# Patient Record
Sex: Female | Born: 1980 | Race: Black or African American | Hispanic: No | Marital: Single | Smoking: Current every day smoker
Health system: Southern US, Community
[De-identification: ages and names within clinical notes are randomized; demographics above are authoritative.]

## PROBLEM LIST (undated history)

## (undated) HISTORY — PX: HERNIA REPAIR: SHX51

---

## 2000-07-30 ENCOUNTER — Emergency Department (HOSPITAL_COMMUNITY): Admission: EM | Admit: 2000-07-30 | Discharge: 2000-07-30 | Payer: Self-pay | Admitting: *Deleted

## 2000-11-24 ENCOUNTER — Emergency Department (HOSPITAL_COMMUNITY): Admission: EM | Admit: 2000-11-24 | Discharge: 2000-11-24 | Payer: Self-pay | Admitting: Emergency Medicine

## 2000-11-28 ENCOUNTER — Emergency Department (HOSPITAL_COMMUNITY): Admission: EM | Admit: 2000-11-28 | Discharge: 2000-11-28 | Payer: Self-pay | Admitting: Emergency Medicine

## 2001-09-03 ENCOUNTER — Emergency Department (HOSPITAL_COMMUNITY): Admission: EM | Admit: 2001-09-03 | Discharge: 2001-09-03 | Payer: Self-pay | Admitting: Emergency Medicine

## 2002-01-20 ENCOUNTER — Emergency Department (HOSPITAL_COMMUNITY): Admission: EM | Admit: 2002-01-20 | Discharge: 2002-01-21 | Payer: Self-pay | Admitting: *Deleted

## 2002-12-17 ENCOUNTER — Emergency Department (HOSPITAL_COMMUNITY): Admission: EM | Admit: 2002-12-17 | Discharge: 2002-12-17 | Payer: Self-pay | Admitting: Internal Medicine

## 2003-04-13 ENCOUNTER — Emergency Department (HOSPITAL_COMMUNITY): Admission: EM | Admit: 2003-04-13 | Discharge: 2003-04-14 | Payer: Self-pay | Admitting: *Deleted

## 2003-05-19 ENCOUNTER — Emergency Department (HOSPITAL_COMMUNITY): Admission: EM | Admit: 2003-05-19 | Discharge: 2003-05-19 | Payer: Self-pay | Admitting: *Deleted

## 2003-06-08 ENCOUNTER — Emergency Department (HOSPITAL_COMMUNITY): Admission: EM | Admit: 2003-06-08 | Discharge: 2003-06-09 | Payer: Self-pay | Admitting: Emergency Medicine

## 2003-06-09 ENCOUNTER — Emergency Department (HOSPITAL_COMMUNITY): Admission: EM | Admit: 2003-06-09 | Discharge: 2003-06-10 | Payer: Self-pay | Admitting: Emergency Medicine

## 2003-09-16 ENCOUNTER — Observation Stay (HOSPITAL_COMMUNITY): Admission: RE | Admit: 2003-09-16 | Discharge: 2003-09-16 | Payer: Self-pay | Admitting: Obstetrics and Gynecology

## 2003-10-07 ENCOUNTER — Ambulatory Visit (HOSPITAL_COMMUNITY): Admission: AD | Admit: 2003-10-07 | Discharge: 2003-10-07 | Payer: Self-pay | Admitting: Obstetrics and Gynecology

## 2003-10-25 ENCOUNTER — Ambulatory Visit (HOSPITAL_COMMUNITY): Admission: AD | Admit: 2003-10-25 | Discharge: 2003-10-25 | Payer: Self-pay | Admitting: Obstetrics and Gynecology

## 2003-11-30 ENCOUNTER — Observation Stay (HOSPITAL_COMMUNITY): Admission: AD | Admit: 2003-11-30 | Discharge: 2003-12-01 | Payer: Self-pay | Admitting: Obstetrics and Gynecology

## 2003-12-13 ENCOUNTER — Ambulatory Visit (HOSPITAL_COMMUNITY): Admission: AD | Admit: 2003-12-13 | Discharge: 2003-12-13 | Payer: Self-pay | Admitting: Obstetrics and Gynecology

## 2004-01-03 ENCOUNTER — Inpatient Hospital Stay (HOSPITAL_COMMUNITY): Admission: RE | Admit: 2004-01-03 | Discharge: 2004-01-05 | Payer: Self-pay | Admitting: Obstetrics & Gynecology

## 2004-04-05 ENCOUNTER — Emergency Department (HOSPITAL_COMMUNITY): Admission: EM | Admit: 2004-04-05 | Discharge: 2004-04-05 | Payer: Self-pay | Admitting: Emergency Medicine

## 2004-08-16 ENCOUNTER — Emergency Department (HOSPITAL_COMMUNITY): Admission: EM | Admit: 2004-08-16 | Discharge: 2004-08-16 | Payer: Self-pay | Admitting: Emergency Medicine

## 2004-09-15 ENCOUNTER — Emergency Department (HOSPITAL_COMMUNITY): Admission: EM | Admit: 2004-09-15 | Discharge: 2004-09-15 | Payer: Self-pay | Admitting: *Deleted

## 2004-09-22 ENCOUNTER — Emergency Department (HOSPITAL_COMMUNITY): Admission: EM | Admit: 2004-09-22 | Discharge: 2004-09-22 | Payer: Self-pay | Admitting: Emergency Medicine

## 2004-10-14 ENCOUNTER — Emergency Department (HOSPITAL_COMMUNITY): Admission: EM | Admit: 2004-10-14 | Discharge: 2004-10-14 | Payer: Self-pay | Admitting: Emergency Medicine

## 2004-12-17 ENCOUNTER — Emergency Department (HOSPITAL_COMMUNITY): Admission: EM | Admit: 2004-12-17 | Discharge: 2004-12-17 | Payer: Self-pay | Admitting: Emergency Medicine

## 2005-03-01 ENCOUNTER — Ambulatory Visit (HOSPITAL_COMMUNITY): Admission: AD | Admit: 2005-03-01 | Discharge: 2005-03-01 | Payer: Self-pay | Admitting: Obstetrics & Gynecology

## 2005-09-05 ENCOUNTER — Emergency Department (HOSPITAL_COMMUNITY): Admission: EM | Admit: 2005-09-05 | Discharge: 2005-09-05 | Payer: Self-pay | Admitting: Emergency Medicine

## 2005-12-14 ENCOUNTER — Emergency Department (HOSPITAL_COMMUNITY): Admission: EM | Admit: 2005-12-14 | Discharge: 2005-12-14 | Payer: Self-pay | Admitting: Emergency Medicine

## 2006-01-15 ENCOUNTER — Emergency Department (HOSPITAL_COMMUNITY): Admission: EM | Admit: 2006-01-15 | Discharge: 2006-01-15 | Payer: Self-pay | Admitting: Emergency Medicine

## 2006-05-08 ENCOUNTER — Emergency Department (HOSPITAL_COMMUNITY): Admission: EM | Admit: 2006-05-08 | Discharge: 2006-05-08 | Payer: Self-pay | Admitting: Emergency Medicine

## 2006-05-19 ENCOUNTER — Emergency Department (HOSPITAL_COMMUNITY): Admission: EM | Admit: 2006-05-19 | Discharge: 2006-05-20 | Payer: Self-pay | Admitting: Emergency Medicine

## 2006-05-28 ENCOUNTER — Emergency Department (HOSPITAL_COMMUNITY): Admission: EM | Admit: 2006-05-28 | Discharge: 2006-05-28 | Payer: Self-pay | Admitting: Emergency Medicine

## 2006-08-08 ENCOUNTER — Emergency Department (HOSPITAL_COMMUNITY): Admission: EM | Admit: 2006-08-08 | Discharge: 2006-08-08 | Payer: Self-pay | Admitting: Emergency Medicine

## 2007-01-25 ENCOUNTER — Emergency Department (HOSPITAL_COMMUNITY): Admission: EM | Admit: 2007-01-25 | Discharge: 2007-01-25 | Payer: Self-pay | Admitting: Emergency Medicine

## 2007-02-15 ENCOUNTER — Emergency Department (HOSPITAL_COMMUNITY): Admission: EM | Admit: 2007-02-15 | Discharge: 2007-02-15 | Payer: Self-pay | Admitting: Emergency Medicine

## 2007-07-04 ENCOUNTER — Emergency Department (HOSPITAL_COMMUNITY): Admission: EM | Admit: 2007-07-04 | Discharge: 2007-07-04 | Payer: Self-pay | Admitting: Emergency Medicine

## 2007-07-13 ENCOUNTER — Emergency Department (HOSPITAL_COMMUNITY): Admission: EM | Admit: 2007-07-13 | Discharge: 2007-07-13 | Payer: Self-pay | Admitting: Emergency Medicine

## 2007-09-08 ENCOUNTER — Emergency Department (HOSPITAL_COMMUNITY): Admission: EM | Admit: 2007-09-08 | Discharge: 2007-09-08 | Payer: Self-pay | Admitting: Emergency Medicine

## 2007-10-20 ENCOUNTER — Emergency Department (HOSPITAL_COMMUNITY): Admission: EM | Admit: 2007-10-20 | Discharge: 2007-10-20 | Payer: Self-pay | Admitting: Emergency Medicine

## 2008-03-30 ENCOUNTER — Observation Stay (HOSPITAL_COMMUNITY): Admission: RE | Admit: 2008-03-30 | Discharge: 2008-03-31 | Payer: Self-pay | Admitting: General Surgery

## 2008-03-30 ENCOUNTER — Encounter (INDEPENDENT_AMBULATORY_CARE_PROVIDER_SITE_OTHER): Payer: Self-pay | Admitting: General Surgery

## 2008-10-27 IMAGING — CR DG ABDOMEN ACUTE W/ 1V CHEST
3 series · 3 of 3 positions shown · non-contrast
Comparison: none

CLINICAL DATA: Abdominal pain.   
 ACUTE ABDOMINAL SERIES WITH CHEST - 3 VIEW:

[view not recorded (1 of 3)]
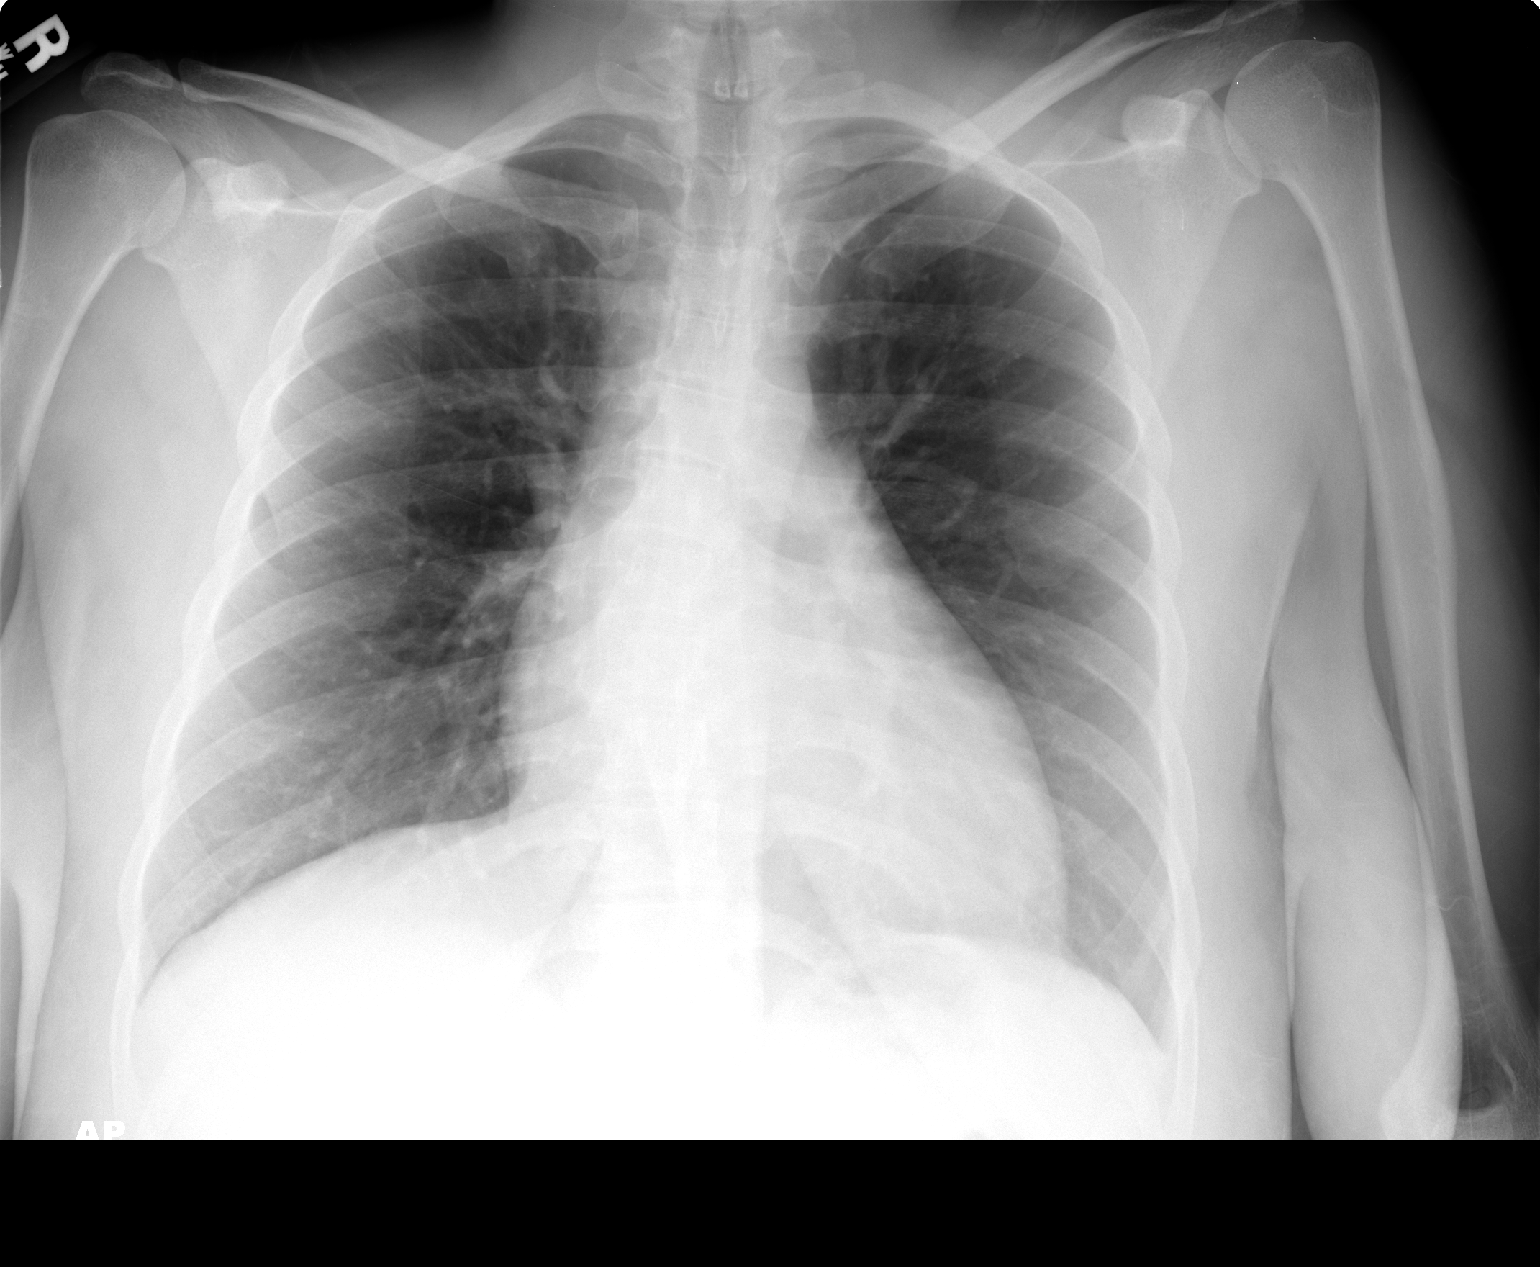

[view not recorded (2 of 3)]
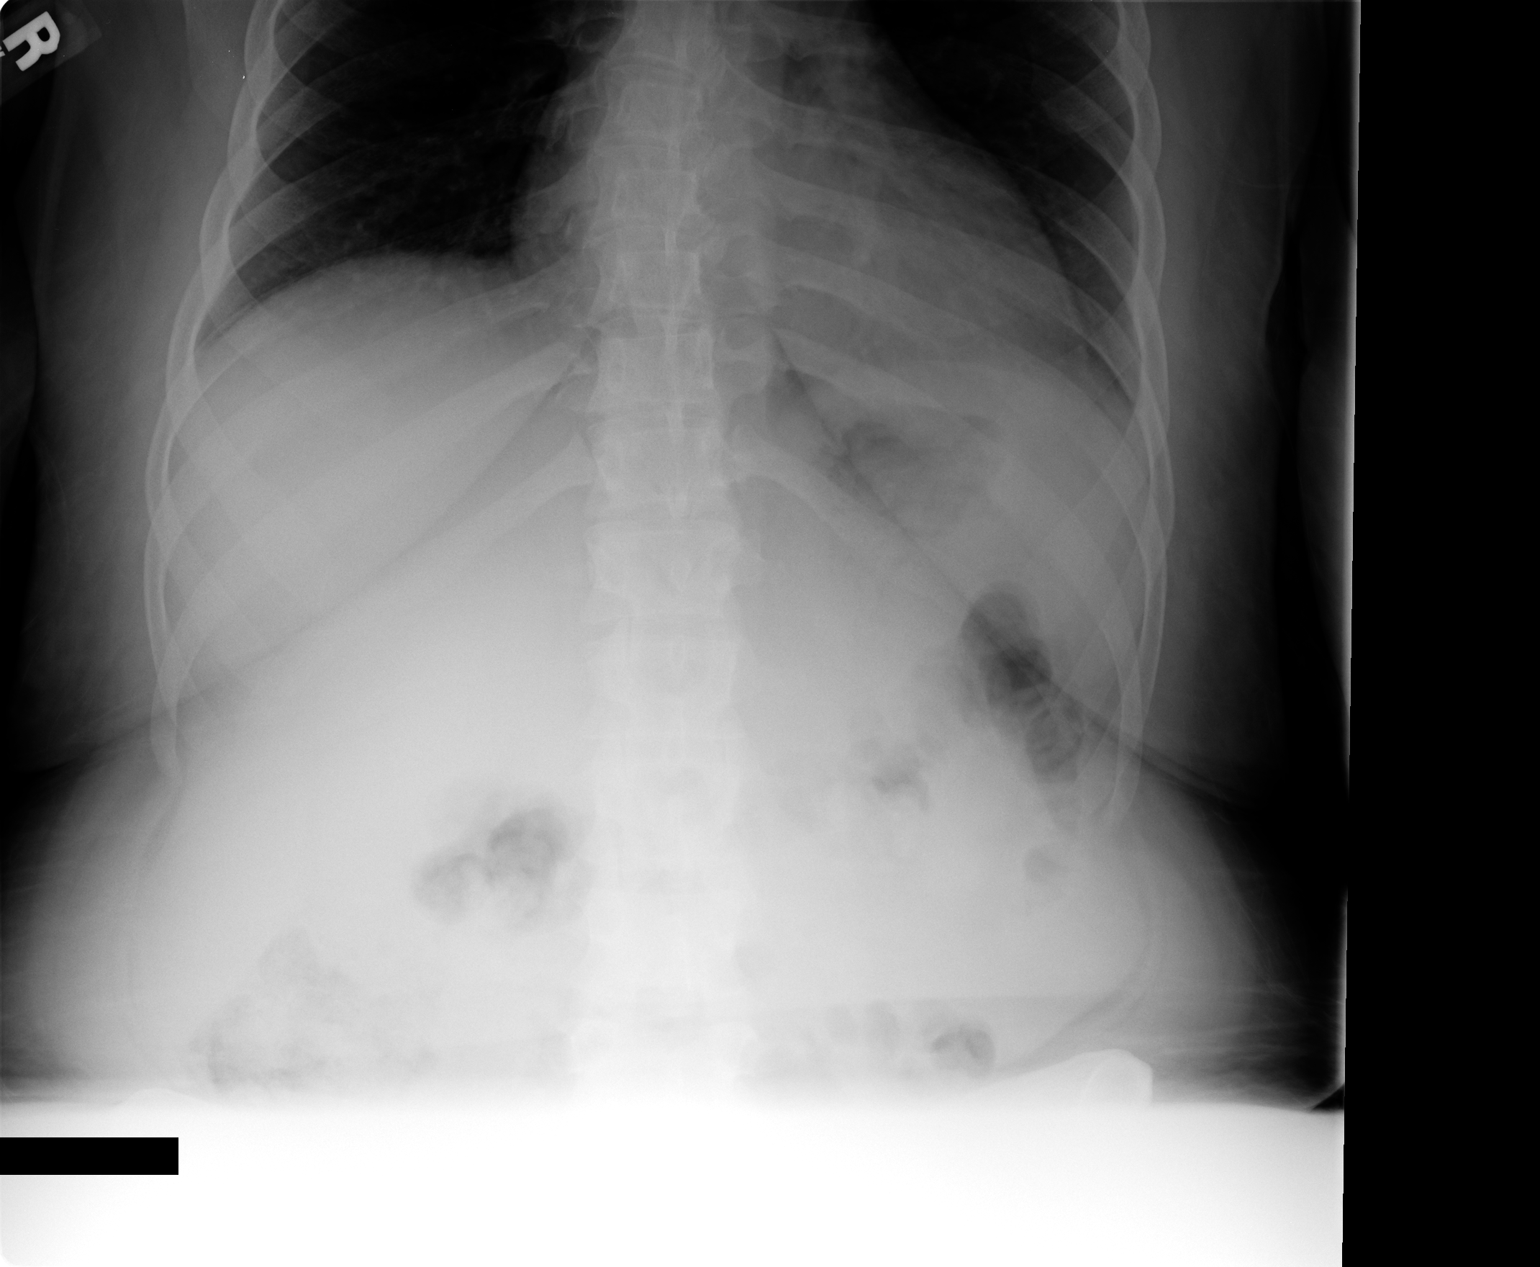

[view not recorded (3 of 3)]
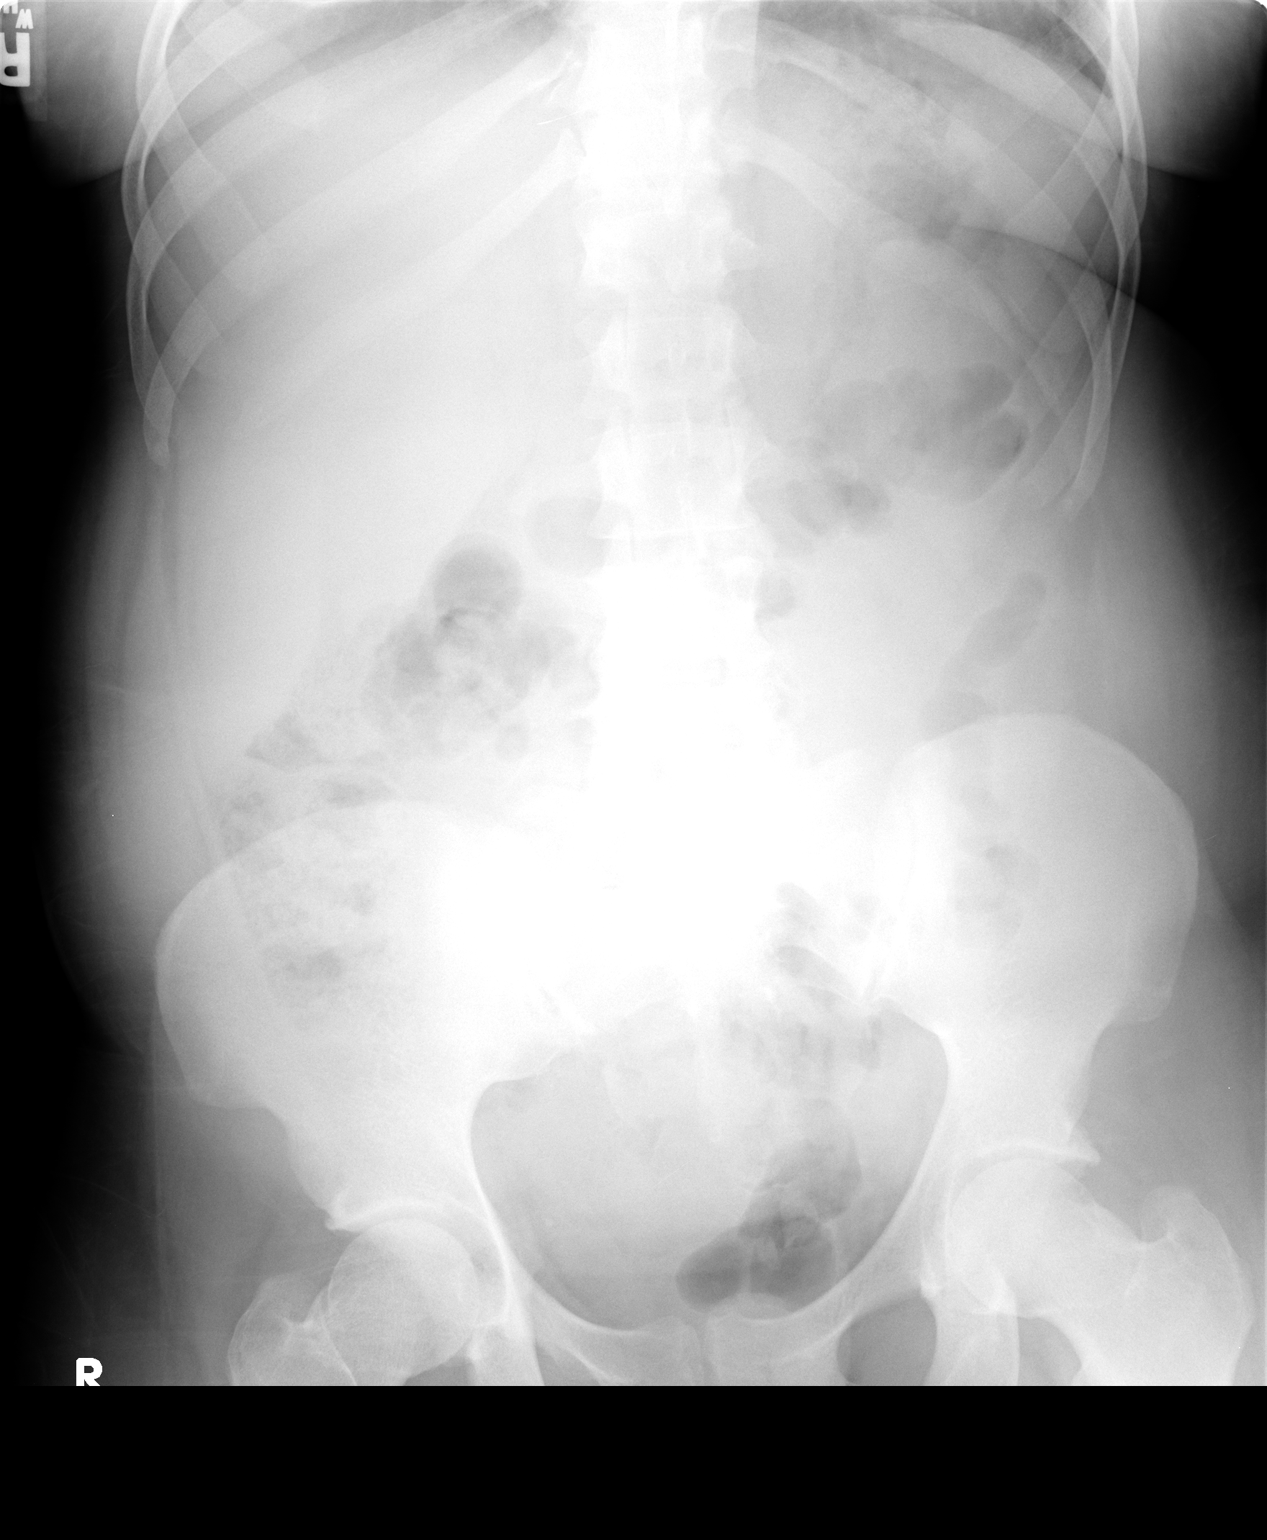

[3 of 3 positions shown; findings below may reference images not displayed]

FINDINGS: The heart is upper normal in size.  Lungs are clear. 
 In the abdomen, there is no free intraperitoneal gas. There are no disproportionately dilated loops of bowel.  Vascular calcifications are present.  Bony framework is within normal limits.
IMPRESSION: Nonobstructive bowel gas pattern.

## 2009-01-05 ENCOUNTER — Emergency Department (HOSPITAL_COMMUNITY): Admission: EM | Admit: 2009-01-05 | Discharge: 2009-01-06 | Payer: Self-pay | Admitting: Emergency Medicine

## 2009-11-05 ENCOUNTER — Emergency Department (HOSPITAL_COMMUNITY): Admission: EM | Admit: 2009-11-05 | Discharge: 2009-11-05 | Payer: Self-pay | Admitting: Emergency Medicine

## 2009-11-07 ENCOUNTER — Emergency Department (HOSPITAL_COMMUNITY): Admission: EM | Admit: 2009-11-07 | Discharge: 2009-11-07 | Payer: Self-pay | Admitting: Emergency Medicine

## 2009-12-16 ENCOUNTER — Emergency Department (HOSPITAL_COMMUNITY): Admission: EM | Admit: 2009-12-16 | Discharge: 2009-12-16 | Payer: Self-pay | Admitting: Emergency Medicine

## 2010-01-03 ENCOUNTER — Emergency Department (HOSPITAL_COMMUNITY): Admission: EM | Admit: 2010-01-03 | Discharge: 2010-01-03 | Payer: Self-pay | Admitting: Emergency Medicine

## 2010-05-03 LAB — HEPATIC FUNCTION PANEL
ALT: 18 U/L (ref 0–35)
AST: 29 U/L (ref 0–37)
Albumin: 3.8 g/dL (ref 3.5–5.2)
Alkaline Phosphatase: 92 U/L (ref 39–117)
Bilirubin, Direct: 0.1 mg/dL (ref 0.0–0.3)
Indirect Bilirubin: 0.7 mg/dL (ref 0.3–0.9)
Total Bilirubin: 0.8 mg/dL (ref 0.3–1.2)
Total Protein: 6.9 g/dL (ref 6.0–8.3)

## 2010-05-03 LAB — DIFFERENTIAL
Basophils Absolute: 0 K/uL (ref 0.0–0.1)
Basophils Absolute: 0.1 10*3/uL (ref 0.0–0.1)
Basophils Relative: 0 % (ref 0–1)
Eosinophils Absolute: 0.3 10*3/uL (ref 0.0–0.7)
Eosinophils Relative: 2 % (ref 0–5)
Eosinophils Relative: 3 % (ref 0–5)
Lymphocytes Relative: 12 % (ref 12–46)
Lymphocytes Relative: 32 % (ref 12–46)
Lymphs Abs: 1.2 10*3/uL (ref 0.7–4.0)
Lymphs Abs: 2.7 10*3/uL (ref 0.7–4.0)
Monocytes Absolute: 0.7 10*3/uL (ref 0.1–1.0)
Monocytes Relative: 7 % (ref 3–12)
Neutro Abs: 4.7 10*3/uL (ref 1.7–7.7)
Neutro Abs: 8.6 K/uL — ABNORMAL HIGH (ref 1.7–7.7)
Neutrophils Relative %: 79 % — ABNORMAL HIGH (ref 43–77)

## 2010-05-03 LAB — BASIC METABOLIC PANEL
BUN: 6 mg/dL (ref 6–23)
CO2: 26 mEq/L (ref 19–32)
CO2: 27 mEq/L (ref 19–32)
Calcium: 9.1 mg/dL (ref 8.4–10.5)
Calcium: 9.2 mg/dL (ref 8.4–10.5)
Creatinine, Ser: 0.82 mg/dL (ref 0.4–1.2)
Creatinine, Ser: 0.82 mg/dL (ref 0.4–1.2)
GFR calc Af Amer: >60 mL/min (ref >60)
GFR calc non Af Amer: >60 mL/min (ref >60)
GFR calc non Af Amer: >60 mL/min (ref >60)
Glucose, Bld: 88 mg/dL (ref 70–99)
Glucose, Bld: 95 mg/dL (ref 70–99)
Sodium: 138 mEq/L (ref 135–145)

## 2010-05-03 LAB — URINALYSIS, ROUTINE W REFLEX MICROSCOPIC
Bilirubin Urine: NEGATIVE
Glucose, UA: NEGATIVE mg/dL
Hgb urine dipstick: NEGATIVE
Ketones, ur: NEGATIVE mg/dL
Nitrite: NEGATIVE
Nitrite: NEGATIVE
Protein, ur: NEGATIVE mg/dL
Specific Gravity, Urine: 1.02 (ref 1.005–1.030)
Specific Gravity, Urine: 1.02 (ref 1.005–1.030)
Urobilinogen, UA: 0.2 mg/dL (ref 0.0–1.0)
Urobilinogen, UA: 0.2 mg/dL (ref 0.0–1.0)
pH: 6.5 (ref 5.0–8.0)
pH: 6.5 (ref 5.0–8.0)

## 2010-05-03 LAB — BASIC METABOLIC PANEL WITH GFR
BUN: 7 mg/dL (ref 6–23)
Chloride: 106 meq/L (ref 96–112)
Potassium: 3.8 meq/L (ref 3.5–5.1)
Sodium: 138 meq/L (ref 135–145)

## 2010-05-03 LAB — CBC
HCT: 36.3 % (ref 36.0–46.0)
Hemoglobin: 12.2 g/dL (ref 12.0–15.0)
MCH: 29.2 pg (ref 26.0–34.0)
MCHC: 33.7 g/dL (ref 30.0–36.0)
MCV: 86.6 fL (ref 78.0–100.0)
MCV: 87 fL (ref 78.0–100.0)
Platelets: 208 10*3/uL (ref 150–400)
Platelets: 233 10*3/uL (ref 150–400)
RBC: 4.19 MIL/uL (ref 3.87–5.11)
RBC: 4.47 MIL/uL (ref 3.87–5.11)
RDW: 13.9 % (ref 11.5–15.5)
RDW: 13.9 % (ref 11.5–15.5)
WBC: 10.9 K/uL — ABNORMAL HIGH (ref 4.0–10.5)
WBC: 8.2 10*3/uL (ref 4.0–10.5)

## 2010-05-03 LAB — WET PREP, GENITAL

## 2010-05-03 LAB — PREGNANCY, URINE: Preg Test, Ur: NEGATIVE

## 2010-05-03 LAB — GC/CHLAMYDIA PROBE AMP, GENITAL: GC Probe Amp, Genital: NEGATIVE

## 2010-05-03 LAB — URINE MICROSCOPIC-ADD ON

## 2010-05-03 LAB — RPR: RPR Ser Ql: NONREACTIVE

## 2010-06-05 LAB — BASIC METABOLIC PANEL
BUN: 8 mg/dL (ref 6–23)
CO2: 27 mEq/L (ref 19–32)
Calcium: 9 mg/dL (ref 8.4–10.5)
Chloride: 108 mEq/L (ref 96–112)
Creatinine, Ser: 0.75 mg/dL (ref 0.4–1.2)
GFR calc Af Amer: >60 mL/min (ref >60)
GFR calc non Af Amer: >60 mL/min (ref >60)
Glucose, Bld: 111 mg/dL — ABNORMAL HIGH (ref 70–99)
Potassium: 3.8 mEq/L (ref 3.5–5.1)
Sodium: 139 mEq/L (ref 135–145)

## 2010-06-05 LAB — CBC
MCHC: 33.6 g/dL (ref 30.0–36.0)
RBC: 4.3 MIL/uL (ref 3.87–5.11)
RDW: 14.2 % (ref 11.5–15.5)

## 2010-06-05 LAB — DIFFERENTIAL
Basophils Absolute: 0 10*3/uL (ref 0.0–0.1)
Basophils Relative: 0 % (ref 0–1)
Neutro Abs: 5.1 10*3/uL (ref 1.7–7.7)
Neutrophils Relative %: 55 % (ref 43–77)

## 2010-07-03 NOTE — Op Note (Signed)
Debbie Mercado, Debbie Mercado NO.:  [id number]   MEDICAL RECORD NO.:  [id number]          PATIENT TYPE:  OIB   LOCATION:  A327                          FACILITY:  [facility]   PHYSICIAN:  Barbaraann Barthel, M.D. DATE OF BIRTH:  Jan 30, 1981   DATE OF PROCEDURE:  03/30/2008  DATE OF DISCHARGE:                               OPERATIVE REPORT   PREOPERATIVE DIAGNOSIS:  Umbilical hernia.   POSTOPERATIVE DIAGNOSIS:  Umbilical hernia.   SPECIMEN:  Omentum.   WOUND CLASSIFICATION:  Clean.   NOTE:  Gross operative findings incarcerated umbilical hernia with  incarcerated omentum.  Defect approximately the size of 50 cent piece.   Note, this is a 30 year old black female who presented to my office  after being referred from the health department with an umbilical  hernia.  The patient had no GI symptoms and there was no obvious  incarcerated omentum present.  We planned for elective surgery.  We  discussed the procedure in detail, discussing complications not limited  including bleeding, infection, and recurrence.  Informed consent was  obtained.   TECHNIQUE:  The patient was placed in supine position after the adequate  administration of general anesthesia.  Her abdomen was prepped with  technique care prep because she had a seafood and a Betadine, iodine  allergy.  Prior to this, we aseptically placed a Foley catheter.  I then  made a smiley face incision in the inferior aspect of the umbilicus.  Dissected the umbilicus skin from the hernia sac and then opened the  hernia sac and removed the incarcerated umbilical hernia which was  tunneling subcutaneously.  Portion of this omentum was ligated with 2-0  silk and divided and sent as a specimen.  The rest was returned to the  abdominal cavity and then we closed the umbilical hernia defect using O  monofilament Prolene using horizontal mattress sutures and then using a  running suture over this to obtain a two-layer closure.  We then  used  1.5% Sensorcaine to help with postoperative comfort.  We irrigated the  subcutaneous area around and the incision with bacitracin solution and  then tacked the umbilicus skin to the fascia in order to reconstitute  the natural concave appearance of the umbilicus.  Closed the  subcutaneous tissue with 3-0 Polysorb and then obtained a subcuticular  closure cosmetically with a 5-0 Polysorb suture.  Quarter  inch Steri-Strips, 2 x 2 dressing permeated with bacitracin solution and  an OpSite dressing was applied.  Prior to closure all sponge, needle,  instrument counts found to be correct.  Estimated blood loss was  minimal.  The patient received 900 mL of crystalloids intraoperatively.  There were no complications.      Barbaraann Barthel, M.D.  Electronically Signed     WB/MEDQ  D:  03/30/2008  T:  03/31/2008  Job:  [id number]

## 2010-07-06 NOTE — Discharge Summary (Signed)
Debbie Mercado, Debbie Mercado             ACCOUNT NO.:  [id number]   MEDICAL RECORD NO.:  [id number]          PATIENT TYPE:  INP   LOCATION:  A401                          FACILITY:  [facility]   PHYSICIAN:  Lazaro Arms, M.D.   DATE OF BIRTH:  07-Jun-1980   DATE OF ADMISSION:  01/03/2004  DATE OF DISCHARGE:  11/17/2005LH                                 DISCHARGE SUMMARY   DISCHARGE DIAGNOSES:  1.  Status post repeat Cesarean section.  2.  Unremarkable postoperative course.   PROCEDURE:  Repeat Cesarean section.   HISTORY OF PRESENT ILLNESS:  Please refer to the transcribed history and  physical and operative report, prenatal chart for details of the patient's  admission to the hospital.   HOSPITAL COURSE:  The patient was admitted after surgery.  She had a totally  unremarkable course.  She remained afebrile.  She tolerated regular diet.  She voided without symptoms.  She was ambulatory.  Her hemoglobin and  hematocrit on postoperative day #1 were 10.1 and 28.9 with white blood cell  count of 10,600.  Her incision was clean, dry and intact.  She was  discharged to home on the morning of postoperative day #2 in good, stable  condition to follow up in the office next Monday, January 09, 2004 to have  her incision evaluated and staples removed.  She was given Tylox and Motrin  for pain upon the time of discharge.  She is given instructions and  precautions for contacting our office prior to Monday.     Luth   LHE/MEDQ  D:  01/05/2004  T:  01/05/2004  Job:  [id number]

## 2010-07-06 NOTE — Op Note (Signed)
NAMELUDELL, ZACARIAS             ACCOUNT NO.:  [id number]   MEDICAL RECORD NO.:  [id number]          PATIENT TYPE:  INP   LOCATION:  A401                          FACILITY:  [facility]   PHYSICIAN:  Lazaro Arms, M.D.   DATE OF BIRTH:  1980/08/13   DATE OF PROCEDURE:  DATE OF DISCHARGE:                                 OPERATIVE REPORT   PREOPERATIVE DIAGNOSES:  1.  Intrauterine pregnancy, 39 weeks.  2.  Previous C section.   POSTOPERATIVE DIAGNOSES:  1.  Intrauterine pregnancy, 39 weeks.  2.  Previous C section.   PROCEDURE:  Repeat cesarean section.   SURGEON:  Dr. Theora Gianotti.   ANESTHESIA:  Spinal.   FINDINGS:  Over a low-transverse hysterotomy incision was delivered a viable  female infant at 41 with Apgars of 9 and 9 with weight to be determined in  the nursing.  There was a three-vessel cord.  Cord blood and cord gas was  sent.  Placenta was normal and intact.  The uterus, tubes and ovaries were  all normal.   DESCRIPTION OF OPERATION:  The patient was taken to the operating room and  placed in the sitting position where she underwent a spinal anesthetic.  She  was then placed in the supine position with a roll under her right hip.  She  was prepped and draped in the usual sterile fashion.  A Pfannenstiel skin  incision was made and carried down sharply through the rectus fascia which  was scored in the midline and extended laterally.  The fascia was taken off  the muscles superiorly and inferiorly without difficulty.  The muscles were  divided.  Peritoneal cavity was entered.  A bladder blade was placed.  The  vesicouterine serosal flap was created.  A low-transverse hysterotomy  incision was made.  Over this incision was delivered a viable female infant  at 41 with Apgars of 9 and 9 with weight to be determined in the nursery.  There was a three vessel cord.  The cord blood and cord gas was sent.  The  placenta was normal and intact.  Uterus was exteriorized, closed in  two  layers, the first being a running, interlocking layer, and the second being  an imbricating layer.  An additional interrupted figure-of-eight suture was  placed for hemostasis.  The uterus was hemostatic.  The uterus was replaced  into the peritoneal cavity.  Both the pericolic gutters were irrigated using  warm normal saline.  The muscles and peritoneum reapproximated loosely.  The  fascia was closed using 0-Vicryl running, subcutaneous tissue was made  hemostatic and irrigated.  The skin was closed using skin staples.   The patient tolerated the procedure well.  She experienced 500 cc of blood  loss. She was taken to the recovery room in good, stable condition.  All  counts were correct x3.     Luth   LHE/MEDQ  D:  01/03/2004  T:  01/03/2004  Job:  [id number]

## 2010-07-06 NOTE — Discharge Summary (Signed)
Debbie Mercado, Debbie Mercado NO.:  [id number]   MEDICAL RECORD NO.:  [id number]          PATIENT TYPE:  OIB   LOCATION:  LDR5                          FACILITY:  [facility]   PHYSICIAN:  Lazaro Arms, M.D.   DATE OF BIRTH:  21-Mar-1980   DATE OF ADMISSION:  03/01/2005  DATE OF DISCHARGE:  01/12/2007LH                                 DISCHARGE SUMMARY   HOSPITAL COURSE:  [name] came to labor and delivery this morning with  complaints of being [redacted] weeks pregnant and having vaginal bleeding.  [name]  is a former patient of ours and, in fact, had a repeat C section a little  over a year ago.  She states that she has been living in [place] for  [facility] and has received prenatal care down there.  Upon further  questioning, she states that she had a positive pregnancy test in November,  and said that she was given a due date of Jul 08, 2005, which would place  her at about 21 weeks' gestation.  She says that she has not had any  ultrasound.  We were unable to get any prenatal records.  She said she  received care in [place], [place], but could not remember the name  of the doctor or the groups that she was seeing.  The nurses could not  obtain fetal heart tones on her, so I was asked to come evaluate her.  She  complained of just some spotting now.  I performed an abdominal ultrasound  and no pregnancy was detected.  I informed [name] of this and she  really did not react, just sort of chuckled a little bit.  I then ordered a  quantitative HCG in case it was just a very early pregnancy that I was  missing, however, that was less than 2.  [name] had signed out AMA  before the results of her lab tests were back saying that she had to go pick  her children up.  We told her to call back for the results of her blood test  and if it was positive, then we would bring her back in and do an  ultrasound.  At this time, as far as I know, she has not called back.   IMPRESSION:  Nonpregnant female with some spotting.  A physical exam had not  been done due to the patient's leaving against medical advice.      Jacklyn Shell, C.N.M.      Lazaro Arms, M.D.  Electronically Signed    FC/MEDQ  D:  03/01/2005  T:  03/01/2005  Job:  [id number]

## 2010-07-29 ENCOUNTER — Emergency Department (HOSPITAL_COMMUNITY)
Admission: EM | Admit: 2010-07-29 | Discharge: 2010-07-29 | Disposition: A | Discharge status: Home / Self Care | Payer: Medicaid Other | Attending: Emergency Medicine | Admitting: Emergency Medicine

## 2010-07-29 DIAGNOSIS — K420 Umbilical hernia with obstruction, without gangrene: Secondary | ICD-10-CM | POA: Insufficient documentation

## 2010-07-29 DIAGNOSIS — F17200 Nicotine dependence, unspecified, uncomplicated: Secondary | ICD-10-CM | POA: Insufficient documentation

## 2010-07-29 DIAGNOSIS — F3289 Other specified depressive episodes: Secondary | ICD-10-CM | POA: Insufficient documentation

## 2010-07-29 DIAGNOSIS — F329 Major depressive disorder, single episode, unspecified: Secondary | ICD-10-CM | POA: Insufficient documentation

## 2010-08-02 ENCOUNTER — Encounter (HOSPITAL_COMMUNITY): Payer: Medicaid Other

## 2010-08-02 ENCOUNTER — Other Ambulatory Visit: Payer: Self-pay | Admitting: General Surgery

## 2010-08-02 ENCOUNTER — Other Ambulatory Visit: Payer: Self-pay | Admitting: Pulmonary Disease

## 2010-08-02 LAB — BASIC METABOLIC PANEL
CO2: 29 mEq/L (ref 19–32)
Chloride: 102 mEq/L (ref 96–112)
GFR calc non Af Amer: >60 mL/min (ref >60)
Glucose, Bld: 90 mg/dL (ref 70–99)
Potassium: 4.1 mEq/L (ref 3.5–5.1)
Sodium: 138 mEq/L (ref 135–145)

## 2010-08-02 LAB — CBC
Hemoglobin: 12.4 g/dL (ref 12.0–15.0)
RBC: 4.34 MIL/uL (ref 3.87–5.11)
WBC: 9 10*3/uL (ref 4.0–10.5)

## 2010-08-02 LAB — SURGICAL PCR SCREEN: Staphylococcus aureus: NEGATIVE

## 2010-08-02 LAB — DIFFERENTIAL
Basophils Absolute: 0 10*3/uL (ref 0.0–0.1)
Basophils Relative: 0 % (ref 0–1)
Neutro Abs: 5.2 10*3/uL (ref 1.7–7.7)
Neutrophils Relative %: 58 % (ref 43–77)

## 2010-08-03 ENCOUNTER — Other Ambulatory Visit: Payer: Self-pay | Admitting: General Surgery

## 2010-08-03 ENCOUNTER — Observation Stay (HOSPITAL_COMMUNITY)
Admission: RE | Admit: 2010-08-03 | Discharge: 2010-08-04 | Disposition: A | Discharge status: Home / Self Care | Payer: Medicaid Other | Source: Ambulatory Visit | Attending: General Surgery | Admitting: General Surgery

## 2010-08-03 DIAGNOSIS — K436 Other and unspecified ventral hernia with obstruction, without gangrene: Principal | ICD-10-CM | POA: Insufficient documentation

## 2010-08-03 DIAGNOSIS — Z01812 Encounter for preprocedural laboratory examination: Secondary | ICD-10-CM | POA: Insufficient documentation

## 2010-08-03 LAB — HCG, SERUM, QUALITATIVE: Preg, Serum: NEGATIVE

## 2010-08-15 NOTE — Op Note (Signed)
  Debbie Mercado, Debbie NO.:  [id number]  MEDICAL RECORD NO.:  [id number]  LOCATION:  A214                          FACILITY:  [facility]  PHYSICIAN:  Barbaraann Barthel, M.D. DATE OF BIRTH:  02-05-1981  DATE OF PROCEDURE:  08/03/2010 DATE OF DISCHARGE:                              OPERATIVE REPORT   SURGEON:  Barbaraann Barthel, MD  PREOPERATIVE DIAGNOSIS:  Ventral hernia.  POSTOPERATIVE DIAGNOSIS:  Ventral hernia (supraumbilical with incarcerated omentum).  Note, this is a 30 year old black female who presented with a painful mass above the umbilicus in the midline.  This was clinically a ventral hernia.  She had undergone an umbilical hernia repair in 2010.  However, this did not appear to be related to a recurrence.  We made plans for elective repair discussing complications including bleeding, infection, and the possibility that mesh might be required.  Informed consent was obtained.  GROSS OPERATIVE FINDINGS:  The patient had a defect about the size of a silver dollar that in my opinion did not require any mesh prosthesis.  I did palpate down to the umbilicus, and there was no sign of any recurrent umbilical hernia defect.  TECHNIQUE:  The patient was placed in supine position after the adequate administration of general anesthesia via endotracheal intubation.  Her abdomen was prepped with antiseptic solution that did not have Betadine in it because of her alleged allergy to Debbie Mercado.  She was prepped and draped, and a Foley catheter was aseptically inserted, and incision was carried out on the midline through skin, subcutaneous tissue down to the fascia, which then revealed incarcerated omentum.  We returned most of the omentum to the intra-abdominal cavity.  There was a portion which was very adherent, which we ligated with 2-0 silk and divided, and then we closed the defect using horizontal mattress sutures using 0 monofilament Prolene sutures.  We then  irrigated, I then closed the subcutaneous space with 3-0 Polysorb sutures, and used 0.5% Sensorcaine around the fascial defect prior to tying the sutures for postoperative comfort.  The skin was approximated with the stapling device.  Prior to closure, all sponge, needle, and instrument counts found to be correct. Estimated blood loss was minimal.  There were no complications.  The patient was taken to the recovery room in satisfactory condition.    Barbaraann Barthel, M.D.    WB/MEDQ  D:  08/03/2010  T:  08/04/2010  Job:  [id number]  Electronically Signed by Barbaraann Barthel M.D. on 08/15/2010 10:08:55 AM

## 2010-08-15 NOTE — Discharge Summary (Signed)
  Debbie Mercado, HOR NO.:  [id number]  MEDICAL RECORD NO.:  [id number]  LOCATION:  A214                          FACILITY:  [facility]  PHYSICIAN:  Barbaraann Barthel, M.D. DATE OF BIRTH:  May 28, 1980  DATE OF ADMISSION:  08/03/2010 DATE OF DISCHARGE:  06/16/2012LH                              DISCHARGE SUMMARY   DIAGNOSIS:  Ventral hernia.  PROCEDURE:  Ventral hernia repair (no mesh required) and this was done on August 03, 2010.  NOTE:  This is a 30 year old black female who presented with a mass in her midline above the umbilicus.  This was consistent with a ventral hernia.  She had undergone a previous repair of an umbilical hernia in 2010.  This did not appear to be related to the previous repair and in fact intraoperatively was found to be completely separate above the umbilicus and a separate defect approximately the size of a silver dollar.  This was closed primarily without the use of mesh.  The patient was brought into the hospital as an observation patient and did well and was discharged on the following day.  Her hospital course was completely uneventful.  Her wound was clean.  At the time of discharge, she had no leg pain or dysuria or shortness of breath and the patient was tolerating p.o. well.  This patient will be followed perioperatively after which she is to return to her regular medical physician for followup.     Barbaraann Barthel, M.D.     WB/MEDQ  D:  08/04/2010  T:  08/04/2010  Job:  [id number]  Electronically Signed by Barbaraann Barthel M.D. on 08/15/2010 10:09:34 AM

## 2010-11-14 LAB — BASIC METABOLIC PANEL
Calcium: 9.3
GFR calc Af Amer: >60
GFR calc non Af Amer: >60
Glucose, Bld: 86
Sodium: 137

## 2010-11-14 LAB — CBC
Hemoglobin: 13.3
RDW: 13.7

## 2010-11-14 LAB — RAPID URINE DRUG SCREEN, HOSP PERFORMED
Barbiturates: NOT DETECTED
Benzodiazepines: NOT DETECTED
Cocaine: NOT DETECTED
Opiates: NOT DETECTED

## 2010-11-14 LAB — DIFFERENTIAL
Basophils Absolute: 0
Lymphocytes Relative: 36
Monocytes Absolute: 0.5
Neutro Abs: 5.8

## 2010-11-21 LAB — URINALYSIS, ROUTINE W REFLEX MICROSCOPIC
Bilirubin Urine: NEGATIVE
Glucose, UA: NEGATIVE
Hgb urine dipstick: NEGATIVE
Specific Gravity, Urine: 1.02
pH: 7

## 2010-11-26 LAB — CBC
Hemoglobin: 13.1
MCHC: 32.8
MCV: 84.4
RBC: 4.72

## 2010-11-26 LAB — DIFFERENTIAL
Basophils Absolute: 0
Eosinophils Absolute: 0.6
Lymphocytes Relative: 42
Lymphs Abs: 3.7
Neutrophils Relative %: 45

## 2010-11-26 LAB — URINALYSIS, ROUTINE W REFLEX MICROSCOPIC
Bilirubin Urine: NEGATIVE
Ketones, ur: NEGATIVE
Nitrite: NEGATIVE
Urobilinogen, UA: 0.2
pH: 5.5

## 2010-11-26 LAB — URINE MICROSCOPIC-ADD ON

## 2010-11-26 LAB — COMPREHENSIVE METABOLIC PANEL
ALT: 14
CO2: 27
Calcium: 8.9
Chloride: 107
Creatinine, Ser: 0.83
GFR calc non Af Amer: >60
Glucose, Bld: 103 — ABNORMAL HIGH
Total Bilirubin: 0.2 — ABNORMAL LOW

## 2010-11-26 LAB — LIPASE, BLOOD: Lipase: 29

## 2010-12-02 ENCOUNTER — Encounter: Payer: Self-pay | Admitting: *Deleted

## 2010-12-02 ENCOUNTER — Emergency Department (HOSPITAL_COMMUNITY)
Admission: EM | Admit: 2010-12-02 | Discharge: 2010-12-02 | Disposition: A | Discharge status: Home / Self Care | Payer: Medicaid Other | Attending: Emergency Medicine | Admitting: Emergency Medicine

## 2010-12-02 DIAGNOSIS — R51 Headache: Secondary | ICD-10-CM

## 2010-12-02 MED ORDER — METOCLOPRAMIDE HCL 5 MG/ML IJ SOLN
10.0000 mg | Freq: Once | INTRAMUSCULAR | Status: AC
  Administered 2010-12-02: 10 mg via INTRAVENOUS
  Filled 2010-12-02: qty 2

## 2010-12-02 MED ORDER — DIPHENHYDRAMINE HCL 50 MG/ML IJ SOLN
25.0000 mg | Freq: Once | INTRAMUSCULAR | Status: AC
  Administered 2010-12-02: 25 mg via INTRAVENOUS
  Filled 2010-12-02: qty 1

## 2010-12-02 MED ORDER — KETOROLAC TROMETHAMINE 30 MG/ML IJ SOLN
30.0000 mg | Freq: Once | INTRAMUSCULAR | Status: AC
  Administered 2010-12-02: 30 mg via INTRAVENOUS
  Filled 2010-12-02: qty 1

## 2010-12-02 MED ORDER — ONDANSETRON HCL 4 MG/2ML IJ SOLN
4.0000 mg | Freq: Once | INTRAMUSCULAR | Status: AC
  Administered 2010-12-02: 4 mg via INTRAVENOUS
  Filled 2010-12-02: qty 2

## 2010-12-02 NOTE — ED Provider Notes (Signed)
History     CSN: [id number] Arrival date & time: 12/02/2010  4:05 AM  Chief Complaint  Patient presents with  . Headache    (Consider location/radiation/quality/duration/timing/severity/associated sxs/prior treatment) HPI Comments: Seen 0501. Patient with sudden onset frontal headache while lying down. Denies vision changes, hearing changes, stiff neck, nausea, vomiting, difficulty talking or swallowing.Denies numbness, tingling, weakness.  Patient is a 30 y.o. female presenting with headaches. The history is provided by the patient.  Headache  The current episode started less than 1 hour ago (Patient was lying down and felt the headache coming on. Frontal, throbbing.). The problem occurs constantly. The problem has not changed since onset.The headache is associated with bright light and loud noise. The pain is located in the frontal region. The quality of the pain is described as sharp and throbbing. The pain is at a severity of 6/10. The pain is moderate. The pain does not radiate. Pertinent negatives include no anorexia, no fever, no near-syncope, no nausea and no vomiting. She has tried nothing for the symptoms.    History reviewed. No pertinent past medical history.  History reviewed. No pertinent past surgical history.  No family history on file.  History  Substance Use Topics  . Smoking status: Never Smoker   . Smokeless tobacco: Not on file  . Alcohol Use: No    OB History    Grav Para Term Preterm Abortions TAB SAB Ect Mult Living                  Review of Systems  Constitutional: Negative for fever.  Cardiovascular: Negative for near-syncope.  Gastrointestinal: Negative for nausea, vomiting and anorexia.  Neurological: Positive for headaches.  All other systems reviewed and are negative.    Allergies  Review of patient's allergies indicates no known allergies.  Home Medications  No current outpatient prescriptions on file.  BP 141/71  Pulse 77   Temp(Src) 98.3 F (36.8 C) (Oral)  Resp 20  Ht 5\' 4"  (1.626 m)  Wt 165 lb (74.844 kg)  BMI 28.32 kg/m2  SpO2 99%  LMP 11/16/2010  Physical Exam  Nursing note and vitals reviewed. Constitutional: She is oriented to person, place, and time. She appears well-developed and well-nourished. She appears distressed.  HENT:  Head: Normocephalic and atraumatic.  Right Ear: External ear normal.  Left Ear: External ear normal.  Nose: Nose normal.  Mouth/Throat: Oropharynx is clear and moist.  Eyes: EOM are normal. Pupils are equal, round, and reactive to light.  Neck: Normal range of motion. Neck supple.  Cardiovascular: Normal rate, normal heart sounds and intact distal pulses.   Pulmonary/Chest: Effort normal and breath sounds normal.  Abdominal: Soft. Bowel sounds are normal.  Musculoskeletal: Normal range of motion.  Neurological: She is alert and oriented to person, place, and time. She has normal reflexes. She displays normal reflexes. No cranial nerve deficit. Coordination normal.       Speech normal, no focal neurological findings.  Skin: Skin is warm and dry.    ED Course  Procedures (including critical care time)  Patient with sudden onset headache while lying down. Relief of headache with antiemetic, antiinflammatory, antihistamine.Pt feels improved after observation and/or treatment in ED.Pt stable in ED with no significant deterioration in condition. MDM Reviewed: nursing note and vitals          Nicoletta Dress. Colon Branch, MD 12/02/10 [phone]

## 2010-12-02 NOTE — ED Notes (Signed)
Pt reports she does not have a history of migraine type or any kind of headache, EDP advised of pt's status

## 2010-12-02 NOTE — ED Notes (Signed)
Pt advised edp would be in there shortly. Pt upset about wait time

## 2010-12-02 NOTE — ED Notes (Signed)
Pt reports severe headache starting approx 1 hr ago

## 2012-04-10 ENCOUNTER — Emergency Department (HOSPITAL_COMMUNITY): Payer: Medicaid Other

## 2012-04-10 ENCOUNTER — Encounter (HOSPITAL_COMMUNITY): Payer: Self-pay | Admitting: Emergency Medicine

## 2012-04-10 ENCOUNTER — Emergency Department (HOSPITAL_COMMUNITY)
Admission: EM | Admit: 2012-04-10 | Discharge: 2012-04-10 | Disposition: A | Discharge status: Home / Self Care | Payer: Medicaid Other | Attending: Emergency Medicine | Admitting: Emergency Medicine

## 2012-04-10 DIAGNOSIS — F17200 Nicotine dependence, unspecified, uncomplicated: Secondary | ICD-10-CM | POA: Insufficient documentation

## 2012-04-10 DIAGNOSIS — X500XXA Overexertion from strenuous movement or load, initial encounter: Secondary | ICD-10-CM | POA: Insufficient documentation

## 2012-04-10 DIAGNOSIS — Y939 Activity, unspecified: Secondary | ICD-10-CM | POA: Insufficient documentation

## 2012-04-10 DIAGNOSIS — S93409A Sprain of unspecified ligament of unspecified ankle, initial encounter: Secondary | ICD-10-CM

## 2012-04-10 DIAGNOSIS — Y9289 Other specified places as the place of occurrence of the external cause: Secondary | ICD-10-CM | POA: Insufficient documentation

## 2012-04-10 MED ORDER — IBUPROFEN 800 MG PO TABS
800.0000 mg | ORAL_TABLET | Freq: Once | ORAL | Status: AC
  Administered 2012-04-10: 800 mg via ORAL
  Filled 2012-04-10: qty 1

## 2012-04-10 NOTE — ED Notes (Signed)
Inversion injury to left ankle this am - stepped in a hole

## 2012-04-10 NOTE — ED Provider Notes (Signed)
History     CSN: [id number]  Arrival date & time 04/10/12  0041   First MD Initiated Contact with Patient 04/10/12 0059      No chief complaint on file.   (Consider location/radiation/quality/duration/timing/severity/associated sxs/prior treatment) HPI REAGANN DOLCE is a 32 y.o. female who presents to the Emergency Department complaining of left ankle pain after stepping in a hole in her mother's yard. Foot is swollen and sore.  History reviewed. No pertinent past medical history.  Past Surgical History  Procedure Laterality Date  . Hernia repair    . Cesarean section      times three    No family history on file.  History  Substance Use Topics  . Smoking status: Heavy Tobacco Smoker -- 0.50 packs/day  . Smokeless tobacco: Not on file  . Alcohol Use: No    OB History   Grav Para Term Preterm Abortions TAB SAB Ect Mult Living                  Review of Systems  Constitutional: Negative for fever.       10 Systems reviewed and are negative for acute change except as noted in the HPI.  HENT: Negative for congestion.   Eyes: Negative for discharge and redness.  Respiratory: Negative for cough and shortness of breath.   Cardiovascular: Negative for chest pain.  Gastrointestinal: Negative for vomiting and abdominal pain.  Musculoskeletal: Negative for back pain.       Left ankle pain  Skin: Negative for rash.  Neurological: Negative for syncope, numbness and headaches.  Psychiatric/Behavioral:       No behavior change.    Allergies  Review of patient's allergies indicates no known allergies.  Home Medications   Current Outpatient Rx  Name  Route  Sig  Dispense  Refill  . Multiple Vitamin (MULTIVITAMIN) capsule   Oral   Take 1 capsule by mouth daily.           BP 137/66  Pulse 89  Temp(Src) 97.2 F (36.2 C) (Oral)  Resp 20  SpO2 99%  LMP 03/21/2012  Physical Exam  Nursing note and vitals reviewed. Constitutional:  Awake, alert, nontoxic  appearance.  HENT:  Head: Atraumatic.  Eyes: Right eye exhibits no discharge. Left eye exhibits no discharge.  Neck: Neck supple.  Cardiovascular: Normal rate.   Pulmonary/Chest: Effort normal and breath sounds normal. She exhibits no tenderness.  Abdominal: Soft. Bowel sounds are normal. There is no tenderness. There is no rebound.  Musculoskeletal: She exhibits no tenderness.  Baseline ROM, no obvious new focal weakness. Left ankle with swelling, tenderness with FROM. Pulses DP and PT are 2+  Neurological:  Mental status and motor strength appears baseline for patient and situation.  Skin: No rash noted.  Psychiatric: She has a normal mood and affect.    ED Course  Procedures (including critical care time)  No results found.  MDM  Patient with swelling and pain to the left ankle after stopping in a hole outdoors. She had not taken any medicines. Given ibuprofen. Film negative for broken bones. Placed an ACE wrap around foot. Pt stable in ED with no significant deterioration in condition.The patient appears reasonably screened and/or stabilized for discharge and I doubt any other medical condition or other [facility] requiring further screening, evaluation, or treatment in the ED at this time prior to discharge.  MDM Reviewed: nursing note and vitals Interpretation: x-ray  Nicoletta Dress. Colon Branch, MD 04/10/12 [phone]

## 2012-06-29 ENCOUNTER — Ambulatory Visit (INDEPENDENT_AMBULATORY_CARE_PROVIDER_SITE_OTHER): Payer: Medicaid Other | Admitting: Obstetrics & Gynecology

## 2012-06-29 ENCOUNTER — Encounter: Payer: Self-pay | Admitting: Obstetrics & Gynecology

## 2012-06-29 VITALS — BP 100/60 | Ht 65.0 in | Wt 194.0 lb

## 2012-06-29 DIAGNOSIS — Z3200 Encounter for pregnancy test, result unknown: Secondary | ICD-10-CM

## 2012-06-29 DIAGNOSIS — Z3202 Encounter for pregnancy test, result negative: Secondary | ICD-10-CM

## 2012-07-24 ENCOUNTER — Ambulatory Visit: Payer: Medicaid Other | Admitting: Obstetrics & Gynecology

## 2012-08-10 ENCOUNTER — Other Ambulatory Visit: Payer: Medicaid Other | Admitting: Obstetrics & Gynecology

## 2013-12-20 ENCOUNTER — Encounter: Payer: Self-pay | Admitting: Obstetrics & Gynecology

## 2014-04-24 ENCOUNTER — Encounter (HOSPITAL_COMMUNITY): Payer: Self-pay

## 2014-04-24 ENCOUNTER — Emergency Department (HOSPITAL_COMMUNITY)
Admission: EM | Admit: 2014-04-24 | Discharge: 2014-04-24 | Disposition: A | Discharge status: Home / Self Care | Payer: Medicaid Other | Attending: Emergency Medicine | Admitting: Emergency Medicine

## 2014-04-24 DIAGNOSIS — K088 Other specified disorders of teeth and supporting structures: Secondary | ICD-10-CM | POA: Diagnosis not present

## 2014-04-24 DIAGNOSIS — Z79899 Other long term (current) drug therapy: Secondary | ICD-10-CM | POA: Diagnosis not present

## 2014-04-24 DIAGNOSIS — K029 Dental caries, unspecified: Secondary | ICD-10-CM | POA: Insufficient documentation

## 2014-04-24 DIAGNOSIS — Z720 Tobacco use: Secondary | ICD-10-CM | POA: Insufficient documentation

## 2014-04-24 DIAGNOSIS — K0889 Other specified disorders of teeth and supporting structures: Secondary | ICD-10-CM

## 2014-04-24 MED ORDER — TRAMADOL HCL 50 MG PO TABS
50.0000 mg | ORAL_TABLET | Freq: Once | ORAL | Status: AC
  Administered 2014-04-24: 50 mg via ORAL
  Filled 2014-04-24: qty 1

## 2014-04-24 MED ORDER — PENICILLIN V POTASSIUM 500 MG PO TABS: 500.0000 mg | ORAL_TABLET | Freq: Four times a day (QID) | ORAL | Status: AC

## 2014-04-24 MED ORDER — TRAMADOL HCL 50 MG PO TABS: 50.0000 mg | ORAL_TABLET | Freq: Four times a day (QID) | ORAL | Status: DC | PRN

## 2014-04-24 MED ORDER — AMOXICILLIN 250 MG PO CAPS
500.0000 mg | ORAL_CAPSULE | Freq: Once | ORAL | Status: AC
Start: 2014-04-24 — End: 2014-04-24
  Administered 2014-04-24: 500 mg via ORAL
  Filled 2014-04-24: qty 2

## 2014-04-24 NOTE — Discharge Instructions (Signed)
Dental Pain  Toothache is pain in or around a tooth. It may get worse with chewing or with cold or heat.   HOME CARE  · Your dentist may use a numbing medicine during treatment. If so, you may need to avoid eating until the medicine wears off. Ask your dentist about this.  · Only take medicine as told by your dentist or doctor.  · Avoid chewing food near the painful tooth until after all treatment is done. Ask your dentist about this.  GET HELP RIGHT AWAY IF:   · The problem gets worse or new problems appear.  · You have a fever.  · There is redness and puffiness (swelling) of the face, jaw, or neck.  · You cannot open your mouth.  · There is pain in the jaw.  · There is very bad pain that is not helped by medicine.  MAKE SURE YOU:   · Understand these instructions.  · Will watch your condition.  · Will get help right away if you are not doing well or get worse.  Document Released: 07/24/2007 Document Revised: 04/29/2011 Document Reviewed: 07/24/2007  [employer]® Patient Information ©2015 [employer]. This information is not intended to replace advice given to you by your health care provider. Make sure you discuss any questions you have with your health care provider.

## 2014-04-24 NOTE — ED Provider Notes (Signed)
CSN: [id number]     Arrival date & time 04/24/14  2019 History   First MD Initiated Contact with Patient 04/24/14 2032     Chief Complaint  Patient presents with  . Dental Pain     (Consider location/radiation/quality/duration/timing/severity/associated sxs/prior Treatment) HPI   Debbie Mercado is a 34 y.o. female who presents to the Emergency Department complaining of dental pain.  She states that a portion of her left upper tooth "broke off" earlier today while chewing.  She reports constant, sharp pain radiating from her tooth to her ear.  Pain is worse with chewing.  She took an aleve without relief.  She denies bleeding of her gums, facial swelling, fever or neck pain.     History reviewed. No pertinent past medical history. Past Surgical History  Procedure Laterality Date  . Hernia repair    . Cesarean section      times three  . Hernia repair     Family History  Problem Relation Age of Onset  . Diabetes Mother    History  Substance Use Topics  . Smoking status: Current Every Day Smoker -- 0.25 packs/day for 2 years    Types: Cigarettes  . Smokeless tobacco: Never Used  . Alcohol Use: No   OB History    Gravida Para Term Preterm AB TAB SAB Ectopic Multiple Living   Review of Systems  Constitutional: Negative for fever and appetite change.  HENT: Positive for dental problem. Negative for congestion, facial swelling, sore throat and trouble swallowing.   Eyes: Negative for pain and visual disturbance.  Musculoskeletal: Negative for neck pain and neck stiffness.  Neurological: Negative for dizziness, facial asymmetry and headaches.  Hematological: Negative for adenopathy.  All other systems reviewed and are negative.     Allergies  Fish allergy  Home Medications   Prior to Admission medications   Medication Sig Start Date End Date Taking? Authorizing Provider  Multiple Vitamin (MULTIVITAMIN) capsule Take 1 capsule by mouth daily.     Historical Provider, MD   BP 132/75 mmHg  Pulse 69  Temp(Src) 98.7 F (37.1 C) (Oral)  Resp 20  Ht  (1.651 m)  Wt 220 lb (99.791 kg)  BMI 36.61 kg/m2  SpO2 100%  LMP 04/17/2014 Physical Exam  Constitutional: She is oriented to person, place, and time. She appears well-developed and well-nourished. No distress.  HENT:  Head: Normocephalic and atraumatic.  Right Ear: Tympanic membrane and ear canal normal.  Left Ear: Tympanic membrane and ear canal normal.  Mouth/Throat: Uvula is midline, oropharynx is clear and moist and mucous membranes are normal. No trismus in the jaw. Dental caries present. No dental abscesses or uvula swelling.  Tenderness and dental caries of the left upper third molar.  No facial swelling, obvious dental abscess, trismus, or sublingual abnml.    Neck: Normal range of motion. Neck supple.  Cardiovascular: Normal rate, regular rhythm and normal heart sounds.   No murmur heard. Pulmonary/Chest: Effort normal and breath sounds normal. No respiratory distress.  Musculoskeletal: Normal range of motion.  Lymphadenopathy:    She has no cervical adenopathy.  Neurological: She is alert and oriented to person, place, and time. She exhibits normal muscle tone. Coordination normal.  Skin: Skin is warm and dry.  Nursing note and vitals reviewed.   ED Course  Procedures (including critical care time) Labs Review Labs Reviewed - No data to display  Imaging Review No results found.   EKG Interpretation None      MDM   Final diagnoses:  Pain, dental   Patient is well appearing.  Vitals stable.  No concerning sx's for infection to the floor of the mouth or deep structures of the neck.  Pt agrees to f/u with her dentist.      Gisela Lea L. Rowe Robertriplett, PA-C 04/24/14 2110  Benny LennertJoseph L Zammit, MD 04/24/14 2223

## 2014-04-24 NOTE — ED Notes (Signed)
Broken left upper molar started hurting this morning

## 2014-11-10 ENCOUNTER — Encounter (HOSPITAL_COMMUNITY): Payer: Self-pay | Admitting: *Deleted

## 2014-11-10 ENCOUNTER — Emergency Department (HOSPITAL_COMMUNITY)
Admission: EM | Admit: 2014-11-10 | Discharge: 2014-11-10 | Disposition: A | Discharge status: Home / Self Care | Payer: Medicaid Other | Attending: Emergency Medicine | Admitting: Emergency Medicine

## 2014-11-10 DIAGNOSIS — Z791 Long term (current) use of non-steroidal anti-inflammatories (NSAID): Secondary | ICD-10-CM | POA: Insufficient documentation

## 2014-11-10 DIAGNOSIS — Z3202 Encounter for pregnancy test, result negative: Secondary | ICD-10-CM | POA: Diagnosis not present

## 2014-11-10 DIAGNOSIS — Z720 Tobacco use: Secondary | ICD-10-CM | POA: Diagnosis not present

## 2014-11-10 DIAGNOSIS — N938 Other specified abnormal uterine and vaginal bleeding: Secondary | ICD-10-CM | POA: Insufficient documentation

## 2014-11-10 DIAGNOSIS — N939 Abnormal uterine and vaginal bleeding, unspecified: Secondary | ICD-10-CM

## 2014-11-10 DIAGNOSIS — R42 Dizziness and giddiness: Secondary | ICD-10-CM | POA: Insufficient documentation

## 2014-11-10 DIAGNOSIS — M545 Low back pain: Secondary | ICD-10-CM | POA: Insufficient documentation

## 2014-11-10 LAB — CBC
HCT: 37.8 % (ref 36.0–46.0)
Hemoglobin: 12.7 g/dL (ref 12.0–15.0)
MCH: 28.7 pg (ref 26.0–34.0)
MCHC: 33.6 g/dL (ref 30.0–36.0)
MCV: 85.5 fL (ref 78.0–100.0)
PLATELETS: 288 10*3/uL (ref 150–400)
RBC: 4.42 MIL/uL (ref 3.87–5.11)
RDW: 13.9 % (ref 11.5–15.5)
WBC: 9.5 10*3/uL (ref 4.0–10.5)

## 2014-11-10 LAB — BASIC METABOLIC PANEL
Anion gap: 4 — ABNORMAL LOW (ref 5–15)
BUN: 7 mg/dL (ref 6–20)
CALCIUM: 8.6 mg/dL — AB (ref 8.9–10.3)
CO2: 26 mmol/L (ref 22–32)
CREATININE: 0.72 mg/dL (ref 0.44–1.00)
Chloride: 106 mmol/L (ref 101–111)
Glucose, Bld: 98 mg/dL (ref 65–99)
Potassium: 3.7 mmol/L (ref 3.5–5.1)
SODIUM: 136 mmol/L (ref 135–145)

## 2014-11-10 LAB — I-STAT BETA HCG BLOOD, ED (MC, WL, AP ONLY)

## 2014-11-10 MED ORDER — MEDROXYPROGESTERONE ACETATE 5 MG PO TABS: 5.0000 mg | ORAL_TABLET | Freq: Every day | ORAL | Status: DC

## 2014-11-10 NOTE — ED Provider Notes (Signed)
CSN: [id number]     Arrival date & time 11/10/14  1053 History   First MD Initiated Contact with Patient 11/10/14 1101     Chief Complaint  Patient presents with  . Vaginal Bleeding    HPI Comments: Patient states that she started her menstrual period on 10/26/14 and this then she has had continued vaginal bleeding.  Blood is bright red and has small clots. She states she is changing tampons every 2 hours and they are saturated. Patient states that this morning she started feeling light-headed and dizzy. Denies any syncopal episode. Bleeding has not stopped for more than 24hrs. She states she has normal menstrual cycles that are every month with bleeding for 3 days. She has had unprotected intercourse with a new partner and is not on birth control.   Patient is a 34 y.o. female presenting with vaginal bleeding.  Vaginal Bleeding Quality:  Bright red, clots and heavier than menses Timing:  Constant Chronicity:  New Menstrual history:  Regular Associated symptoms: back pain and dizziness   Associated symptoms: no abdominal pain, no dysuria, no fever, no nausea and no vaginal discharge   Risk factors: new sexual partner and unprotected sex   Risk factors: no bleeding disorder     History reviewed. No pertinent past medical history. Past Surgical History  Procedure Laterality Date  . Hernia repair    . Cesarean section      times three  . Hernia repair     Family History  Problem Relation Age of Onset  . Diabetes Mother    Social History  Substance Use Topics  . Smoking status: Current Every Day Smoker -- 0.25 packs/day for 2 years    Types: Cigarettes  . Smokeless tobacco: Never Used  . Alcohol Use: No   OB History    Gravida Para Term Preterm AB TAB SAB Ectopic Multiple Living   Review of Systems  Constitutional: Negative for fever.  Gastrointestinal: Negative for nausea and abdominal pain.  Genitourinary: Positive for vaginal bleeding. Negative for  dysuria, hematuria, vaginal discharge, vaginal pain and pelvic pain.  Musculoskeletal: Positive for back pain.  Neurological: Positive for dizziness and light-headedness.  Also per HPI  Allergies  Fish allergy  Home Medications   Prior to Admission medications   Medication Sig Start Date End Date Taking? Authorizing Provider  naproxen sodium (ANAPROX) 220 MG tablet Take 440 mg by mouth 2 (two) times daily with a meal.   Yes Historical Provider, MD  medroxyPROGESTERone (PROVERA) 5 MG tablet Take 1 tablet (5 mg total) by mouth daily. 11/10/14   Pincus Large, DO  traMADol (ULTRAM) 50 MG tablet Take 1 tablet (50 mg total) by mouth every 6 (six) hours as needed. Patient not taking: Reported on 11/10/2014 04/24/14   Tammy Triplett, PA-C   BP 129/76 mmHg  Pulse 61  Temp(Src) 98.9 F (37.2 C) (Oral)  Resp 18  Ht  (1.651 m)  Wt 198 lb (89.812 kg)  BMI 32.95 kg/m2  SpO2 97% Physical Exam  Constitutional: She appears well-developed and well-nourished. No distress.  HENT:  Head: Normocephalic and atraumatic.  Mouth/Throat: Oropharynx is clear and moist.  Eyes: EOM are normal.  Neck: Normal range of motion. Neck supple.  Cardiovascular: Normal rate, regular rhythm, normal heart sounds and intact distal pulses.   Pulmonary/Chest: Effort normal and breath sounds normal.  Abdominal: Bowel sounds are normal. There is no  tenderness.  Genitourinary: Cervix exhibits no motion tenderness. There is bleeding in the vagina. No vaginal discharge found.  Musculoskeletal: Normal range of motion.  Neurological: She is alert.  Grossly non-focal  Skin: Skin is warm and dry.  Psychiatric: She has a normal mood and affect.    ED Course  Procedures (including critical care time) Labs Review Labs Reviewed  BASIC METABOLIC PANEL - Abnormal; Notable for the following:    Calcium 8.6 (*)    Anion gap 4 (*)    All other components within normal limits  CBC  I-STAT BETA HCG BLOOD, ED ([facility], [facility], [facility]  ONLY)    Imaging Review No results found. I have personally reviewed and evaluated these images and lab results as part of my medical decision-making.   EKG Interpretation None      MDM   Final diagnoses:  Abnormal vaginal bleeding   Patient presented to the ED with abnormal prolonged vaginal bleeding. This is new in acuity. She is hemodynamically stable.   Labs unremarkable. No signs of anemia. Hcg normal; not pregnant. Pelvic exam was also unremarkable.  Patient given prescription for progesterone for abnormal uterine bleeding. Discussed with patient that this should stop her abnormal bleeding. She is to follow with her PCP for further evaluation.  Discharged home in stable condition. Patient agreeable to plan.   Caryl Ada, DO 11/10/2014, 3:03 PM PGY-2, [facility]    Pincus Large, DO 11/10/14 1478  Nelva Nay, MD 11/11/14 [phone]

## 2014-11-10 NOTE — ED Notes (Signed)
Pt continues to have vaginal bleeding since her period on 10/26/14. Pt states she is having to change her pad every 2 hours. Pt denies any pain. Pt states she has been having lightheadness and dizziness starting this morning.

## 2014-11-10 NOTE — Discharge Instructions (Signed)
Please make sure to follow-up with PCP in the next 2 weeks Started you on a medication to stop uterine bleeding. Need to take daily for 10 days.  No signs of anemia but please stay hydrated as you are losing blood Return to ED if bleeding worsens, feel faint, fever or any of the below indications to get help  Abnormal Uterine Bleeding Abnormal uterine bleeding means bleeding from the vagina that is not your normal menstrual period. This can be:  Bleeding or spotting between periods.  Bleeding after sex (sexual intercourse).  Bleeding that is heavier or more than normal.  Periods that last longer than usual.  Bleeding after menopause. There are many problems that may cause this. Treatment will depend on the cause of the bleeding. Any kind of bleeding that is not normal should be reviewed by your doctor.  HOME CARE Watch your condition for any changes. These actions may lessen any discomfort you are having:  Do not use tampons or douches as told by your doctor.  Change your pads often. You should get regular pelvic exams and Pap tests. Keep all appointments for tests as told by your doctor. GET HELP IF:  You are bleeding for more than 1 week.  You feel dizzy at times. GET HELP RIGHT AWAY IF:   You pass out.  You have to change pads every 15 to 30 minutes.  You have belly pain.  You have a fever.  You become sweaty or weak.  You are passing large blood clots from the vagina.  You feel sick to your stomach (nauseous) and throw up (vomit). MAKE SURE YOU:  Understand these instructions.  Will watch your condition.  Will get help right away if you are not doing well or get worse. Document Released: 12/02/2008 Document Revised: 02/09/2013 Document Reviewed: 09/03/2012 [facility] Patient Information 2015 [place], [place]. This information is not intended to replace advice given to you by your health care provider. Make sure you discuss any questions you have with your health  care provider.

## 2015-01-18 ENCOUNTER — Emergency Department (HOSPITAL_COMMUNITY)
Admission: EM | Admit: 2015-01-18 | Discharge: 2015-01-18 | Disposition: A | Discharge status: Home / Self Care | Payer: Medicaid Other | Attending: Emergency Medicine | Admitting: Emergency Medicine

## 2015-01-18 ENCOUNTER — Encounter (HOSPITAL_COMMUNITY): Payer: Self-pay | Admitting: *Deleted

## 2015-01-18 ENCOUNTER — Emergency Department (HOSPITAL_COMMUNITY): Payer: Medicaid Other

## 2015-01-18 DIAGNOSIS — J069 Acute upper respiratory infection, unspecified: Secondary | ICD-10-CM | POA: Insufficient documentation

## 2015-01-18 DIAGNOSIS — R05 Cough: Secondary | ICD-10-CM | POA: Diagnosis present

## 2015-01-18 DIAGNOSIS — Z791 Long term (current) use of non-steroidal anti-inflammatories (NSAID): Secondary | ICD-10-CM | POA: Diagnosis not present

## 2015-01-18 DIAGNOSIS — Z79899 Other long term (current) drug therapy: Secondary | ICD-10-CM | POA: Diagnosis not present

## 2015-01-18 DIAGNOSIS — F17210 Nicotine dependence, cigarettes, uncomplicated: Secondary | ICD-10-CM | POA: Diagnosis not present

## 2015-01-18 DIAGNOSIS — J9801 Acute bronchospasm: Secondary | ICD-10-CM | POA: Insufficient documentation

## 2015-01-18 LAB — RAPID STREP SCREEN (MED CTR MEBANE ONLY): STREPTOCOCCUS, GROUP A SCREEN (DIRECT): NEGATIVE

## 2015-01-18 MED ORDER — ALBUTEROL SULFATE HFA 108 (90 BASE) MCG/ACT IN AERS
2.0000 | INHALATION_SPRAY | RESPIRATORY_TRACT | Status: DC | PRN
  Administered 2015-01-18: 2 via RESPIRATORY_TRACT
  Filled 2015-01-18: qty 6.7

## 2015-01-18 MED ORDER — AEROCHAMBER Z-STAT PLUS/MEDIUM MISC
1.0000 | Freq: Once | Status: AC
  Administered 2015-01-18: 1
  Filled 2015-01-18: qty 1

## 2015-01-18 MED ORDER — ALBUTEROL SULFATE (2.5 MG/3ML) 0.083% IN NEBU
2.5000 mg | INHALATION_SOLUTION | Freq: Once | RESPIRATORY_TRACT | Status: AC
  Administered 2015-01-18: 2.5 mg via RESPIRATORY_TRACT
  Filled 2015-01-18: qty 3

## 2015-01-18 MED ORDER — IPRATROPIUM-ALBUTEROL 0.5-2.5 (3) MG/3ML IN SOLN
3.0000 mL | Freq: Once | RESPIRATORY_TRACT | Status: AC
  Administered 2015-01-18: 3 mL via RESPIRATORY_TRACT
  Filled 2015-01-18: qty 3

## 2015-01-18 MED ORDER — IPRATROPIUM BROMIDE 0.02 % IN SOLN: 0.5000 mg | Freq: Once | RESPIRATORY_TRACT | Status: DC

## 2015-01-18 MED ORDER — ALBUTEROL SULFATE (2.5 MG/3ML) 0.083% IN NEBU: 5.0000 mg | INHALATION_SOLUTION | Freq: Once | RESPIRATORY_TRACT | Status: DC

## 2015-01-18 MED ORDER — DM-GUAIFENESIN ER 30-600 MG PO TB12
1.0000 | ORAL_TABLET | Freq: Once | ORAL | Status: AC
  Administered 2015-01-18: 1 via ORAL
  Filled 2015-01-18: qty 1

## 2015-01-18 NOTE — ED Notes (Signed)
Pt c/o cough, headache, body aches, chills, vomiting that started today,

## 2015-01-18 NOTE — Discharge Instructions (Signed)
Drink plenty of fluids. Use the inhaler for shortness of breath or wheezing. Take mucinex DM OTC twice a day for cough. Recheck if you get a high fever, worsening sore throat, struggle to breathe or if you are still coughing after another 7-10 days.    Cough, Adult A cough helps to clear your throat and lungs. A cough may last only 2-3 weeks (acute), or it may last longer than 8 weeks (chronic). Many different things can cause a cough. A cough may be a sign of an illness or another medical condition. HOME CARE  Pay attention to any changes in your cough.  Take medicines only as told by your doctor.  If you were prescribed an antibiotic medicine, take it as told by your doctor. Do not stop taking it even if you start to feel better.  Talk with your doctor before you try using a cough medicine.  Drink enough fluid to keep your pee (urine) clear or pale yellow.  If the air is dry, use a cold steam vaporizer or humidifier in your home.  Stay away from things that make you cough at work or at home.  If your cough is worse at night, try using extra pillows to raise your head up higher while you sleep.  Do not smoke, and try not to be around smoke. If you need help quitting, ask your doctor.  Do not have caffeine.  Do not drink alcohol.  Rest as needed. GET HELP IF:  You have new problems (symptoms).  You cough up yellow fluid (pus).  Your cough does not get better after 2-3 weeks, or your cough gets worse.  Medicine does not help your cough and you are not sleeping well.  You have pain that gets worse or pain that is not helped with medicine.  You have a fever.  You are losing weight and you do not know why.  You have night sweats. GET HELP RIGHT AWAY IF:  You cough up blood.  You have trouble breathing.  Your heartbeat is very fast.   This information is not intended to replace advice given to you by your health care provider. Make sure you discuss any questions you  have with your health care provider.   Document Released: 10/18/2010 Document Revised: 10/26/2014 Document Reviewed: 04/13/2014 Elsevier Interactive Patient Education [employer]2016 [employer]vier Inc.

## 2015-01-18 NOTE — ED Provider Notes (Signed)
CSN: [id number]     Arrival date & time 01/18/15  0146 History   First MD Initiated Contact with Patient 01/18/15 0155     Chief Complaint  Patient presents with  . Generalized Body Aches     (Consider location/radiation/quality/duration/timing/severity/associated sxs/prior Treatment) HPI patient states she started feeling ill on November 28 in the morning. She has had a cough with mild yellow sputum production rarely, vomiting once to twice a day that is posttussive. She also complains of a mild sore throat on the left. She denies rhinorrhea or fever but states she started having chills yesterday, November 29. She denies chest pain but she has dyspnea on exertion. She denies abdominal pain or diarrhea. She has not taken any medications for her symptoms. She states she has wheezing times. She states she has a son who has reactive airway disease however she has never had to use a nebulizer or inhaler. She did not have a flu shot this year. She states there was a baby that came to visit her on November 28 that was sick with respiratory symptoms, the same day she got ill.   [facility]   History reviewed. No pertinent past medical history. Past Surgical History  Procedure Laterality Date  . Hernia repair    . Cesarean section      times three  . Hernia repair     Family History  Problem Relation Age of Onset  . Diabetes Mother    Social History  Substance Use Topics  . Smoking status: Current Every Day Smoker -- 0.25 packs/day for 2 years    Types: Cigarettes  . Smokeless tobacco: Never Used  . Alcohol Use: No   Smokes 6-8 cigarettes a day Works as in home caregiver  OB History    Gravida Para Term Preterm AB TAB SAB Ectopic Multiple Living   3 3        3      Review of Systems  All other systems reviewed and are negative.     Allergies  Fish allergy  Home Medications   Prior to Admission medications   Medication Sig Start Date End Date  Taking? Authorizing Provider  medroxyPROGESTERone (PROVERA) 5 MG tablet Take 1 tablet (5 mg total) by mouth daily. 11/10/14   Pincus LargeJazma Y Phelps, DO  naproxen sodium (ANAPROX) 220 MG tablet Take 440 mg by mouth 2 (two) times daily with a meal.    Historical Provider, MD  traMADol (ULTRAM) 50 MG tablet Take 1 tablet (50 mg total) by mouth every 6 (six) hours as needed. Patient not taking: Reported on 11/10/2014 04/24/14   Tammy Triplett, PA-C   BP 125/70 mmHg  Pulse 82  Temp(Src) 98.4 F (36.9 C) (Oral)  Resp 16  Ht 5\' 5"  (1.651 m)  Wt 199 lb (90.266 kg)  BMI 33.12 kg/m2  SpO2 96%  LMP  (LMP Unknown)  Vital signs normal   Physical Exam  Constitutional: She is oriented to person, place, and time. She appears well-developed and well-nourished.  Non-toxic appearance. She does not appear ill. No distress.  HENT:  Head: Normocephalic and atraumatic.  Right Ear: External ear normal.  Left Ear: External ear normal.  Nose: Nose normal. No mucosal edema or rhinorrhea.  Mouth/Throat: Oropharynx is clear and moist and mucous membranes are normal. No dental abscesses or uvula swelling.  Patient's red tonsil does appear reddened, however it is not enlarged compared to the right. There are some scattered exudate on the left  tonsil. There is no soft palate swelling, uvula is midline. Her voice is normal.  Eyes: Conjunctivae and EOM are normal. Pupils are equal, round, and reactive to light.  Neck: Normal range of motion and full passive range of motion without pain. Neck supple.  Cardiovascular: Normal rate, regular rhythm and normal heart sounds.  Exam reveals no gallop and no friction rub.   No murmur heard. Pulmonary/Chest: Effort normal and breath sounds normal. No respiratory distress. She has no wheezes. She has no rhonchi. She has no rales. She exhibits no tenderness and no crepitus.  Coughs at times  Abdominal: Soft. Normal appearance and bowel sounds are normal. She exhibits no distension. There  is no tenderness. There is no rebound and no guarding.  Musculoskeletal: Normal range of motion. She exhibits no edema or tenderness.  Moves all extremities well.   Neurological: She is alert and oriented to person, place, and time. She has normal strength. No cranial nerve deficit.  Skin: Skin is warm, dry and intact. No rash noted. No erythema. No pallor.  Psychiatric: She has a normal mood and affect. Her speech is normal and behavior is normal. Her mood appears not anxious.  Nursing note and vitals reviewed.   ED Course  Procedures (including critical care time)  Medications  albuterol (PROVENTIL HFA;VENTOLIN HFA) 108 (90 BASE) MCG/ACT inhaler 2 puff (not administered)  aerochamber Z-Stat Plus/medium 1 each (not administered)  dextromethorphan-guaiFENesin (MUCINEX DM) 30-600 MG per 12 hr tablet 1 tablet (1 tablet Oral Given 01/18/15 0214)  ipratropium-albuterol (DUONEB) 0.5-2.5 (3) MG/3ML nebulizer solution 3 mL (3 mLs Nebulization Given 01/18/15 0214)  albuterol (PROVENTIL) (2.5 MG/3ML) 0.083% nebulizer solution 2.5 mg (2.5 mg Nebulization Given 01/18/15 0215)   Patient is afebrile and has not taken any medications to lower her temperature. She does have some swelling of her left tonsil and a rapid strep was done. I do not hear wheezing during my exam however she states she has heard wheezing and she knows what wheezing is due to her child having respiratory problems. We gave her a albuterol nebulizer to see if that would help with some of her respiratory symptoms. Chest x-ray was done due to her complaints of feeling short of breath.  Recheck at time of discharge patient's laying flat in her stretcher sleeping. She states she is feeling better after the nebulizer treatment. Her cough seems to be improved. We discussed her test results. Patient was discharged home with an inhaler. She was not started on antibiotics because she's only been ill for 2 days, she most likely has a viral illness.  She was given precautions to return to the ER.  Labs Review Results for orders placed or performed during the hospital encounter of 01/18/15  Rapid strep screen  Result Value Ref Range   Streptococcus, Group A Screen (Direct) NEGATIVE NEGATIVE   Laboratory interpretation all normal      Imaging Review Dg Chest 2 View  01/18/2015  CLINICAL DATA:  34 year old female with cough and congestion x1 week EXAM: CHEST  2 VIEW COMPARISON:  Radiograph dated 10/20/2007 FINDINGS: The heart size and mediastinal contours are within normal limits. Both lungs are clear. The visualized skeletal structures are unremarkable. IMPRESSION: No active cardiopulmonary disease. Electronically Signed   By: Elgie Collard M.D.   On: 01/18/2015 03:01   I have personally reviewed and evaluated these images and lab results as part of my medical decision-making.   EKG Interpretation None      MDM  Final diagnoses:  URI (upper respiratory infection)  Bronchospasm   Plan discharge  Devoria Albe, MD, Concha Pyo, MD 01/18/15 [phone]

## 2015-01-20 LAB — CULTURE, GROUP A STREP: STREP A CULTURE: POSITIVE — AB

## 2015-01-21 ENCOUNTER — Telehealth (HOSPITAL_COMMUNITY): Payer: Self-pay

## 2015-01-21 NOTE — Progress Notes (Signed)
ED Antimicrobial Stewardship Positive Culture Follow Up   Debbie Mercado is an 34 y.o. female who [facility] with a chief complaint of  Chief Complaint  Patient presents with  . Generalized Body Aches    Recent Results (from the past 720 hour(s))  Rapid strep screen     Status: None   Collection Time: 01/18/15  2:08 AM  Result Value Ref Range Status   Streptococcus, Group A Screen (Direct) NEGATIVE NEGATIVE Final    Comment: (NOTE) A Rapid Antigen test may result negative if the antigen level in the sample is below the detection level of this test. The FDA has not cleared this test as a stand-alone test therefore the rapid antigen negative result has reflexed to a Group A Strep culture.   Culture, Group A Strep     Status: Abnormal   Collection Time: 01/18/15  2:08 AM  Result Value Ref Range Status   Strep A Culture Positive (A)  Final    Comment: (NOTE) Penicillin and ampicillin are drugs of choice for treatment of beta-hemolytic streptococcal infections. Susceptibility testing of penicillins and other beta-lactam agents approved by the FDA for treatment of beta-hemolytic streptococcal infections need not be performed routinely because nonsusceptible isolates are extremely rare in any beta-hemolytic streptococcus and have not been reported for Streptococcus pyogenes (group A). (CLSI 2011) Performed At: [facility] [place] [address] [place], [place] [id number] Mila HomerHancock William F MD WU:[id number]:[phone]    Rapid strep negative, but culture positive for group A Strep  New antibiotic prescription: Amoxicillin 1000 mg BID x 10 days  ED Provider: Renne CriglerJoshua Geiple, PA-C  Debbie MillardMichael A Simmie Mercado 01/21/2015, 8:46 AM Infectious Diseases Pharmacist Phone# [phone]

## 2015-01-21 NOTE — Telephone Encounter (Signed)
Post ED Visit - Positive Culture Follow-up: Chart Hand-off to ED Flow Manager  Culture assessed and recommendations reviewed by: [x]  Isaac BlissMichael Maccia, Pharm.D., BCPS []  Celedonio MiyamotoJeremy Frens, Pharm.D., BCPS-AQ ID []  Georgina PillionElizabeth Martin, Pharm.D., BCPS []  NeesesMinh Pham, Vermont[place]arm.D., BCPS, AAHIVP []  Estella HuskMichelle Turner, Pharm .D., BCPS, AAHIVP []  Tennis Mustassie Stewart, Pharm.D. []  Casilda Carlsaylor Stone, Pharm.D.  Positive strep culture  [x]  Patient discharged without antimicrobial prescription and treatment is now indicated []  Organism is resistant to prescribed ED discharge antimicrobial []  Patient with positive blood cultures  Changes discussed with ED provider: Rhea BleacherJosh Geiple PA New antibiotic prescription amoxicillin 1 gram po bid x 10 days  [facility] [place] [phone] left on MD voicemail   Ashley JacobsFesterman, Genetta Fiero C 01/21/2015, 11:36 AM

## 2016-02-17 ENCOUNTER — Emergency Department (HOSPITAL_COMMUNITY)
Admission: EM | Admit: 2016-02-17 | Discharge: 2016-02-17 | Disposition: A | Discharge status: Home / Self Care | Payer: Medicaid Other | Attending: Emergency Medicine | Admitting: Emergency Medicine

## 2016-02-17 ENCOUNTER — Encounter (HOSPITAL_COMMUNITY): Payer: Self-pay | Admitting: *Deleted

## 2016-02-17 DIAGNOSIS — R112 Nausea with vomiting, unspecified: Secondary | ICD-10-CM

## 2016-02-17 DIAGNOSIS — J020 Streptococcal pharyngitis: Secondary | ICD-10-CM | POA: Insufficient documentation

## 2016-02-17 DIAGNOSIS — F17210 Nicotine dependence, cigarettes, uncomplicated: Secondary | ICD-10-CM | POA: Diagnosis not present

## 2016-02-17 DIAGNOSIS — J029 Acute pharyngitis, unspecified: Secondary | ICD-10-CM | POA: Diagnosis present

## 2016-02-17 LAB — COMPREHENSIVE METABOLIC PANEL
ALK PHOS: 81 U/L (ref 38–126)
ALT: 15 U/L (ref 14–54)
ANION GAP: 4 — AB (ref 5–15)
AST: 17 U/L (ref 15–41)
Albumin: 4.2 g/dL (ref 3.5–5.0)
BILIRUBIN TOTAL: 0.6 mg/dL (ref 0.3–1.2)
BUN: 11 mg/dL (ref 6–20)
CALCIUM: 9 mg/dL (ref 8.9–10.3)
CO2: 27 mmol/L (ref 22–32)
Chloride: 107 mmol/L (ref 101–111)
Creatinine, Ser: 0.86 mg/dL (ref 0.44–1.00)
GFR calc Af Amer: >60 mL/min (ref >60)
GLUCOSE: 96 mg/dL (ref 65–99)
POTASSIUM: 3.8 mmol/L (ref 3.5–5.1)
Sodium: 138 mmol/L (ref 135–145)
TOTAL PROTEIN: 7.4 g/dL (ref 6.5–8.1)

## 2016-02-17 LAB — PREGNANCY, URINE: Preg Test, Ur: NEGATIVE

## 2016-02-17 LAB — CBC WITH DIFFERENTIAL/PLATELET
BASOS ABS: 0 10*3/uL (ref 0.0–0.1)
BASOS PCT: 0 %
EOS ABS: 0 10*3/uL (ref 0.0–0.7)
Eosinophils Relative: 0 %
HEMATOCRIT: 38.8 % (ref 36.0–46.0)
Hemoglobin: 12.9 g/dL (ref 12.0–15.0)
Lymphocytes Relative: 12 %
Lymphs Abs: 1.9 10*3/uL (ref 0.7–4.0)
MCH: 28.9 pg (ref 26.0–34.0)
MCHC: 33.2 g/dL (ref 30.0–36.0)
MCV: 87 fL (ref 78.0–100.0)
MONO ABS: 0.7 10*3/uL (ref 0.1–1.0)
MONOS PCT: 5 %
NEUTROS ABS: 13 10*3/uL — AB (ref 1.7–7.7)
NEUTROS PCT: 83 %
Platelets: 273 10*3/uL (ref 150–400)
RBC: 4.46 MIL/uL (ref 3.87–5.11)
RDW: 13.8 % (ref 11.5–15.5)
WBC: 15.6 10*3/uL — ABNORMAL HIGH (ref 4.0–10.5)

## 2016-02-17 LAB — URINALYSIS, ROUTINE W REFLEX MICROSCOPIC
BILIRUBIN URINE: NEGATIVE
GLUCOSE, UA: NEGATIVE mg/dL
KETONES UR: NEGATIVE mg/dL
NITRITE: NEGATIVE
PH: 6 (ref 5.0–8.0)
Protein, ur: NEGATIVE mg/dL
SPECIFIC GRAVITY, URINE: 1.018 (ref 1.005–1.030)

## 2016-02-17 LAB — LIPASE, BLOOD: LIPASE: 18 U/L (ref 11–51)

## 2016-02-17 LAB — RAPID STREP SCREEN (MED CTR MEBANE ONLY): Streptococcus, Group A Screen (Direct): POSITIVE — AB

## 2016-02-17 MED ORDER — AZITHROMYCIN 250 MG PO TABS: 250.0000 mg | ORAL_TABLET | Freq: Every day | ORAL | 0 refills | Status: DC

## 2016-02-17 MED ORDER — AMOXICILLIN 250 MG PO CAPS
500.0000 mg | ORAL_CAPSULE | Freq: Once | ORAL | Status: AC
  Administered 2016-02-17: 500 mg via ORAL
  Filled 2016-02-17: qty 2

## 2016-02-17 MED ORDER — AMOXICILLIN 500 MG PO CAPS: 500.0000 mg | ORAL_CAPSULE | Freq: Two times a day (BID) | ORAL | 0 refills | Status: AC

## 2016-02-17 MED ORDER — ONDANSETRON HCL 4 MG/2ML IJ SOLN
4.0000 mg | Freq: Once | INTRAMUSCULAR | Status: AC
  Administered 2016-02-17: 4 mg via INTRAVENOUS
  Filled 2016-02-17: qty 2

## 2016-02-17 MED ORDER — ONDANSETRON HCL 4 MG PO TABS: 4.0000 mg | ORAL_TABLET | Freq: Four times a day (QID) | ORAL | 0 refills | Status: DC

## 2016-02-17 MED ORDER — SODIUM CHLORIDE 0.9 % IV BOLUS (SEPSIS)
1000.0000 mL | Freq: Once | INTRAVENOUS | Status: AC
  Administered 2016-02-17: 1000 mL via INTRAVENOUS

## 2016-02-17 MED ORDER — ALBUTEROL SULFATE HFA 108 (90 BASE) MCG/ACT IN AERS
2.0000 | INHALATION_SPRAY | Freq: Once | RESPIRATORY_TRACT | Status: DC
  Filled 2016-02-17: qty 6.7

## 2016-02-17 MED ORDER — AZITHROMYCIN 250 MG PO TABS
500.0000 mg | ORAL_TABLET | Freq: Once | ORAL | Status: DC
  Filled 2016-02-17: qty 2

## 2016-02-17 MED ORDER — ALBUTEROL SULFATE HFA 108 (90 BASE) MCG/ACT IN AERS: 1.0000 | INHALATION_SPRAY | Freq: Four times a day (QID) | RESPIRATORY_TRACT | 0 refills | Status: DC | PRN

## 2016-02-17 NOTE — Discharge Instructions (Signed)
You have been seen in the Emergency Department (ED) today for nausea and vomiting.  Your work up today has not shown a clear cause for your symptoms. You have been prescribed Zofran; please use as prescribed as needed for your nausea.  We also found that you have strep throat. We have prescribed antibiotics for treatment.   Follow up with your doctor as soon as possible regarding today?s emergent visit and your symptoms of nausea.   Return to the ED if you develop abdominal, bloody vomiting, bloody diarrhea, if you are unable to tolerate fluids due to vomiting, or if you develop other symptoms that concern you.

## 2016-02-17 NOTE — ED Triage Notes (Signed)
Pt woke up this am with body aches, HA and nausea vomiting.  Pt has had chills with this.  Pt is unsure how many times she has vomited as she has had frequent dry heaves.  Pt denies any abdominal pain, GU symptoms.

## 2016-02-17 NOTE — ED Provider Notes (Signed)
Emergency Department Provider Note   I have reviewed the triage vital signs and the nursing notes.   HISTORY  Chief Complaint Emesis   HPI Debbie Mercado is a 35 y.o. female with no significant PMH presents to the emergency department for evaluation of sore throat, vomiting, shaking chills that started this AM. The patient denies any fever. She has vomiting > 7 times. Emesis is non-bloody and non-bilious. Denies abdominal pain. She has a child at home who was recently diagnosed with strep throat and started abx. She has an associated mild HA. Denies CP or difficulty breathing. Nausea and vomiting made worse with PO intake.   History reviewed. No pertinent past medical history.  There are no active problems to display for this patient.   Past Surgical History:  Procedure Laterality Date  . CESAREAN SECTION     times three  . HERNIA REPAIR    . HERNIA REPAIR        Allergies Fish allergy  Family History  Problem Relation Age of Onset  . Diabetes Mother     Social History Social History  Substance Use Topics  . Smoking status: Current Every Day Smoker    Packs/day: 0.25    Years: 2.00    Types: Cigarettes  . Smokeless tobacco: Never Used  . Alcohol use No    Review of Systems  Constitutional: No fever. Positive chills.  Eyes: No visual changes. ENT: Positive sore throat. Cardiovascular: Denies chest pain. Respiratory: Denies shortness of breath. Gastrointestinal: No abdominal pain. Positive nausea and vomiting.  No diarrhea.  No constipation. Genitourinary: Negative for dysuria. Musculoskeletal: Negative for back pain. Skin: Negative for rash. Neurological: Negative for headaches, focal weakness or numbness.  10-point ROS otherwise negative.  ____________________________________________   PHYSICAL EXAM:  VITAL SIGNS: ED Triage Vitals  Enc Vitals Group     BP 02/17/16 1258 143/83     Pulse Rate 02/17/16 1258 82     Resp 02/17/16 1258 17     Temp 02/17/16 1258 98.8 F (37.1 C)     Temp Source 02/17/16 1258 Oral     SpO2 02/17/16 1258 97 %     Weight 02/17/16 1259 175 lb (79.4 kg)     Height 02/17/16 1259 5\' 5"  (1.651 m)     Pain Score 02/17/16 1305 9   Constitutional: Alert and oriented. Covered in a blanket. Appears uncomfortable but in no acute distress.  Eyes: Conjunctivae are normal.  Head: Atraumatic. Nose: No congestion/rhinnorhea. Mouth/Throat: Mucous membranes are dry. Oropharynx with tonsillar hypertrophy, exudate, and redness.  Neck: No stridor.   Cardiovascular: Normal rate, regular rhythm. Good peripheral circulation. Grossly normal heart sounds.   Respiratory: Normal respiratory effort.  No retractions. Lungs CTAB. Gastrointestinal: Soft and nontender. No distention.  Musculoskeletal: No lower extremity tenderness nor edema. No gross deformities of extremities. Neurologic:  Normal speech and language. No gross focal neurologic deficits are appreciated.  Skin:  Skin is warm, dry and intact. No rash noted.  ____________________________________________   LABS (all labs ordered are listed, but only abnormal results are displayed)  Labs Reviewed  RAPID STREP SCREEN (NOT AT [facility]) - Abnormal; Notable for the following:       Result Value   Streptococcus, Group A Screen (Direct) POSITIVE (*)    All other components within normal limits  COMPREHENSIVE METABOLIC PANEL - Abnormal; Notable for the following:    Anion gap 4 (*)    All other components within normal limits  CBC WITH  DIFFERENTIAL/PLATELET - Abnormal; Notable for the following:    WBC 15.6 (*)    Neutro Abs 13.0 (*)    All other components within normal limits  URINALYSIS, ROUTINE W REFLEX MICROSCOPIC - Abnormal; Notable for the following:    APPearance HAZY (*)    Hgb urine dipstick SMALL (*)    Leukocytes, UA LARGE (*)    Bacteria, UA RARE (*)    All other components within normal limits  LIPASE, BLOOD  PREGNANCY, URINE    ____________________________________________  RADIOLOGY  None ____________________________________________   PROCEDURES  Procedure(s) performed:   Procedures  None ____________________________________________   INITIAL IMPRESSION / ASSESSMENT AND PLAN / ED COURSE  Pertinent labs & imaging results that were available during my care of the patient were reviewed by me and considered in my medical decision making (see chart for details).  Patient presents to the emergency department for evaluation of sore throat, chills, mild headache, and multiple episodes of vomiting. She appears slightly uncomfortable on arrival but not unwell. She is having active vomiting in the emergency department. She has close contact who was recently diagnosed with strep throat. Afebrile here in the emergency department. Plan for IV fluids, Zofran, labs including rapid strep.   02:50 PM Patient reassessed after labs resulted. Strep positive. No significant electrolyte abnormalities. Plan for Amoxicillin and return precautions. She is feeling much better. Sitting upright in bed. Feels ready for discharge. Tolerating PO and oral abx in the ED.   At this time, I do not feel there is any life-threatening condition present. I have reviewed and discussed all results (EKG, imaging, lab, urine as appropriate), exam findings with patient. I have reviewed nursing notes and appropriate previous records.  I feel the patient is safe to be discharged home without further emergent workup. Discussed usual and customary return precautions. Patient and family (if present) verbalize understanding and are comfortable with this plan.  Patient will follow-up with their primary care provider. If they do not have a primary care provider, information for follow-up has been provided to them. All questions have been answered.    ____________________________________________  FINAL CLINICAL IMPRESSION(S) / ED DIAGNOSES  Final  diagnoses:  Strep throat  Non-intractable vomiting with nausea, unspecified vomiting type     MEDICATIONS GIVEN DURING THIS VISIT:  Medications  sodium chloride 0.9 % bolus 1,000 mL (0 mLs Intravenous Stopped 02/17/16 1505)  ondansetron (ZOFRAN) injection 4 mg (4 mg Intravenous Given 02/17/16 1353)  amoxicillin (AMOXIL) capsule 500 mg (500 mg Oral Given 02/17/16 1446)     NEW OUTPATIENT MEDICATIONS STARTED DURING THIS VISIT:  New Prescriptions   AMOXICILLIN (AMOXIL) 500 MG CAPSULE    Take 1 capsule (500 mg total) by mouth 2 (two) times daily.   ONDANSETRON (ZOFRAN) 4 MG TABLET    Take 1 tablet (4 mg total) by mouth every 6 (six) hours.      Note:  This document was prepared using Dragon voice recognition software and may include unintentional dictation errors.  Alona BeneJoshua Long, MD Emergency Medicine   Maia PlanJoshua G Long, MD 02/17/16 [phone]

## 2016-10-20 IMAGING — DX DG CHEST 2V
2 series · 2 of 2 positions shown · non-contrast
Comparison: Radiograph dated 10/20/2007

CLINICAL DATA: 34-year-old female with cough and congestion x1 week

EXAM:
CHEST  2 VIEW

[chest pa]
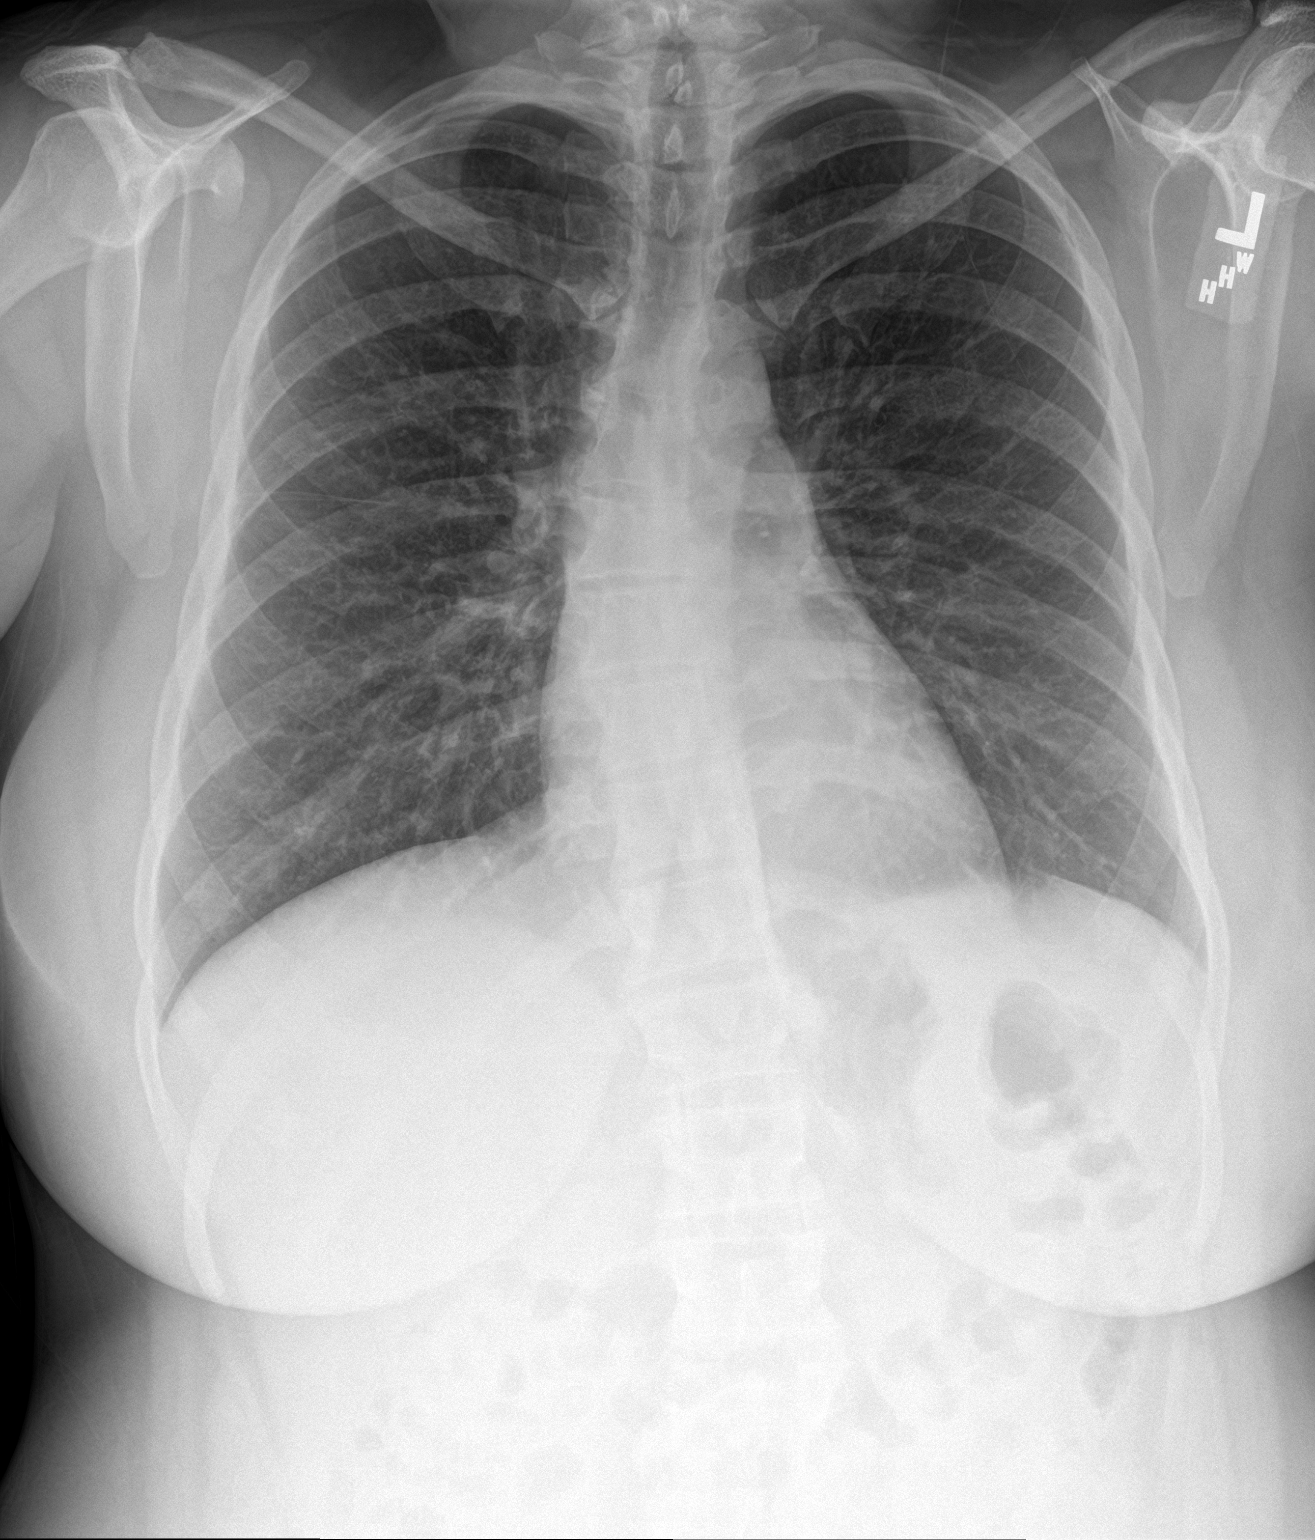

[chest lat]
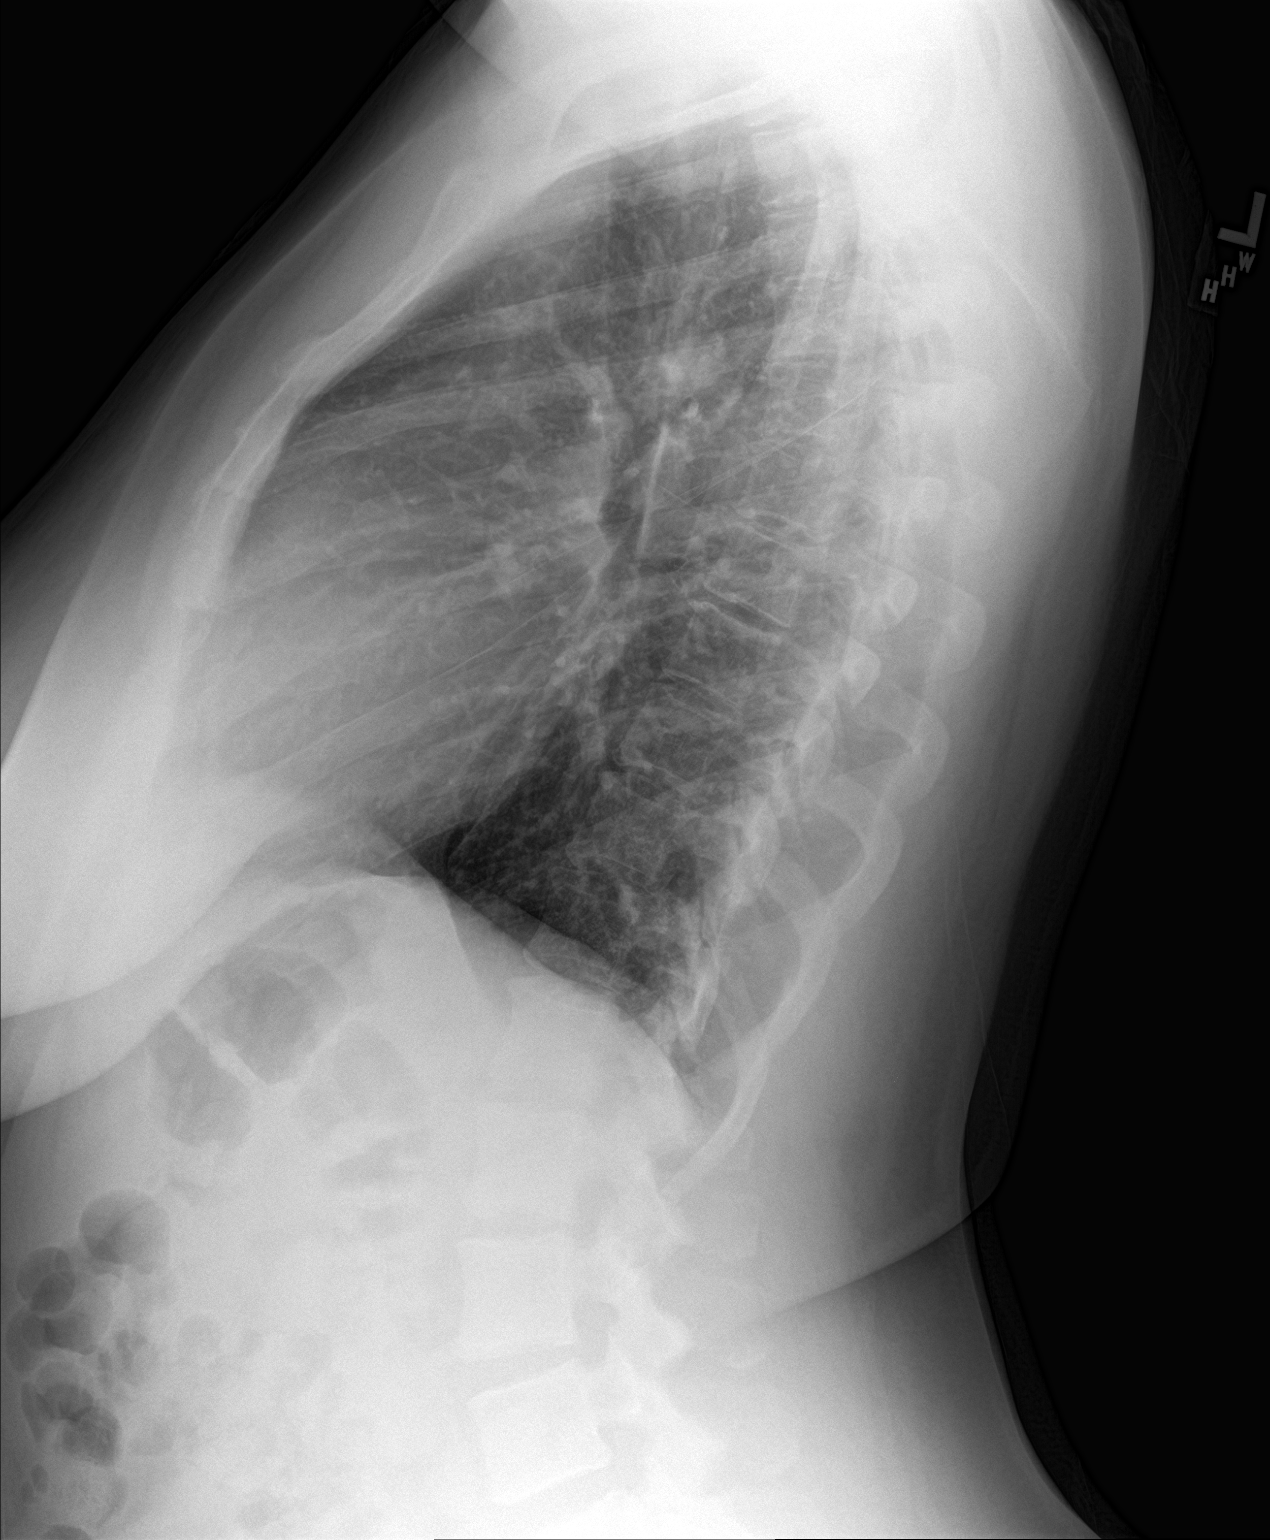

[2 of 2 positions shown; findings below may reference images not displayed]

FINDINGS: The heart size and mediastinal contours are within normal limits.
Both lungs are clear. The visualized skeletal structures are
unremarkable.
IMPRESSION: No active cardiopulmonary disease.

## 2016-12-16 ENCOUNTER — Emergency Department (HOSPITAL_COMMUNITY)
Admission: EM | Admit: 2016-12-16 | Discharge: 2016-12-16 | Discharge status: Against Medical Advice | Payer: Medicaid Other

## 2016-12-16 NOTE — ED Triage Notes (Signed)
Called for Triage X2 with no answer

## 2017-05-21 ENCOUNTER — Encounter (HOSPITAL_COMMUNITY): Payer: Self-pay | Admitting: Emergency Medicine

## 2017-05-21 ENCOUNTER — Other Ambulatory Visit: Payer: Self-pay

## 2017-05-21 ENCOUNTER — Emergency Department (HOSPITAL_COMMUNITY): Payer: Medicaid Other

## 2017-05-21 ENCOUNTER — Emergency Department (HOSPITAL_COMMUNITY)
Admission: EM | Admit: 2017-05-21 | Discharge: 2017-05-21 | Disposition: A | Discharge status: Home / Self Care | Payer: Medicaid Other | Attending: Emergency Medicine | Admitting: Emergency Medicine

## 2017-05-21 DIAGNOSIS — R0789 Other chest pain: Secondary | ICD-10-CM | POA: Diagnosis present

## 2017-05-21 DIAGNOSIS — F17210 Nicotine dependence, cigarettes, uncomplicated: Secondary | ICD-10-CM | POA: Insufficient documentation

## 2017-05-21 DIAGNOSIS — R072 Precordial pain: Secondary | ICD-10-CM

## 2017-05-21 LAB — I-STAT BETA HCG BLOOD, ED (MC, WL, AP ONLY): I-stat hCG, quantitative: <5 m[IU]/mL (ref <5)

## 2017-05-21 LAB — BASIC METABOLIC PANEL
ANION GAP: 11 (ref 5–15)
BUN: 8 mg/dL (ref 6–20)
CHLORIDE: 105 mmol/L (ref 101–111)
CO2: 21 mmol/L — ABNORMAL LOW (ref 22–32)
Calcium: 8.7 mg/dL — ABNORMAL LOW (ref 8.9–10.3)
Creatinine, Ser: 0.91 mg/dL (ref 0.44–1.00)
GFR calc Af Amer: >60 mL/min (ref >60)
GFR calc non Af Amer: >60 mL/min (ref >60)
GLUCOSE: 91 mg/dL (ref 65–99)
Potassium: 3.8 mmol/L (ref 3.5–5.1)
Sodium: 137 mmol/L (ref 135–145)

## 2017-05-21 LAB — CBC
HCT: 38.7 % (ref 36.0–46.0)
HEMOGLOBIN: 12.7 g/dL (ref 12.0–15.0)
MCH: 28.3 pg (ref 26.0–34.0)
MCHC: 32.8 g/dL (ref 30.0–36.0)
MCV: 86.2 fL (ref 78.0–100.0)
Platelets: 233 10*3/uL (ref 150–400)
RBC: 4.49 MIL/uL (ref 3.87–5.11)
RDW: 13.6 % (ref 11.5–15.5)
WBC: 5.6 10*3/uL (ref 4.0–10.5)

## 2017-05-21 LAB — I-STAT TROPONIN, ED: Troponin i, poc: 0.01 ng/mL (ref 0.00–0.08)

## 2017-05-21 MED ORDER — SUCRALFATE 1 G PO TABS: 1.0000 g | ORAL_TABLET | Freq: Three times a day (TID) | ORAL | 0 refills | Status: DC

## 2017-05-21 MED ORDER — GI COCKTAIL ~~LOC~~
30.0000 mL | Freq: Once | ORAL | Status: AC
  Administered 2017-05-21: 30 mL via ORAL
  Filled 2017-05-21: qty 30

## 2017-05-21 MED ORDER — IBUPROFEN 800 MG PO TABS
800.0000 mg | ORAL_TABLET | Freq: Once | ORAL | Status: AC
  Administered 2017-05-21: 800 mg via ORAL
  Filled 2017-05-21: qty 1

## 2017-05-21 MED ORDER — PANTOPRAZOLE SODIUM 20 MG PO TBEC: 20.0000 mg | DELAYED_RELEASE_TABLET | Freq: Every day | ORAL | 0 refills | Status: DC

## 2017-05-21 NOTE — Discharge Instructions (Signed)

## 2017-05-21 NOTE — ED Triage Notes (Signed)
Pt c/o of central chest pain starting last night while lying.  Denies any new injury.

## 2017-05-21 NOTE — ED Provider Notes (Signed)
Emergency Department Provider Note   I have reviewed the triage vital signs and the nursing notes.   HISTORY  Chief Complaint Chest Pain   HPI Debbie Mercado is a 37 y.o. female with no PMH but does have a long smoking history presents to the emergency department for evaluation of central chest pain starting last night.  Pain woke her from sleep at approximately 4 AM this morning and symptoms have been intermittent over the past 8 hours.  Patient states symptoms seem worse with lifting but she has not identified any other modifying factors.  Pain does not radiate.  She denies dyspnea.  No pleuritic chest pain.  She feels the pain is like a pressure in the center of her chest she has not had symptoms like this before.  He does have a family history of coronary artery disease with her grandmother having a heart attack in her 25s.  No history of DVT/PE. Patient does continue to smoke cigarettes daily.    History reviewed. No pertinent past medical history.  There are no active problems to display for this patient.   Past Surgical History:  Procedure Laterality Date  . CESAREAN SECTION     times three  . HERNIA REPAIR    . HERNIA REPAIR        Allergies Fish allergy  Family History  Problem Relation Age of Onset  . Diabetes Mother     Social History Social History   Tobacco Use  . Smoking status: Current Every Day Smoker    Packs/day: 0.25    Years: 2.00    Pack years: 0.50    Types: Cigarettes  . Smokeless tobacco: Never Used  Substance Use Topics  . Alcohol use: No  . Drug use: No    Types: Marijuana    Comment: Last time used was 11/09/14    Review of Systems  Constitutional: No fever/chills Eyes: No visual changes. ENT: No sore throat. Cardiovascular: Positive chest pain. Respiratory: Denies shortness of breath. Gastrointestinal: No abdominal pain.  No nausea, no vomiting.  No diarrhea.  No constipation. Genitourinary: Negative for  dysuria. Musculoskeletal: Negative for back pain. Skin: Negative for rash. Neurological: Negative for headaches, focal weakness or numbness.  10-point ROS otherwise negative.  ____________________________________________   PHYSICAL EXAM:  VITAL SIGNS: ED Triage Vitals  Enc Vitals Group     BP --      Pulse Rate 05/21/17 1200 69     Resp --      Temp 05/21/17 1200 98.4 F (36.9 C)     Temp Source 05/21/17 1200 Oral     SpO2 05/21/17 1200 100 %     Weight 05/21/17 1159 189 lb (85.7 kg)     Height 05/21/17 1159 5\' 5"  (1.651 m)     Pain Score 05/21/17 1159 6   Constitutional: Alert and oriented. Well appearing and in no acute distress. Eyes: Conjunctivae are normal. Head: Atraumatic. Nose: No congestion/rhinnorhea. Mouth/Throat: Mucous membranes are moist.  Neck: No stridor.  Cardiovascular: Normal rate, regular rhythm. Good peripheral circulation. Grossly normal heart sounds.   Respiratory: Normal respiratory effort.  No retractions. Lungs CTAB. Gastrointestinal: Soft and nontender. No distention.  Musculoskeletal: No lower extremity tenderness nor edema. No gross deformities of extremities. Neurologic:  Normal speech and language. No gross focal neurologic deficits are appreciated.  Skin:  Skin is warm, dry and intact. No rash noted.  ____________________________________________   LABS (all labs ordered are listed, but only abnormal results are  displayed)  Labs Reviewed  BASIC METABOLIC PANEL - Abnormal; Notable for the following components:      Result Value   CO2 21 (*)    Calcium 8.7 (*)    All other components within normal limits  CBC  I-STAT TROPONIN, ED  I-STAT BETA HCG BLOOD, ED ([facility], [facility], [facility] ONLY)   ____________________________________________  EKG   EKG Interpretation  Date/Time:  Wednesday May 21 2017 12:08:54 EDT Ventricular Rate:  55 PR Interval:    QRS Duration: 89 QT Interval:  423 QTC Calculation: 405 R Axis:   44 Text Interpretation:   Sinus rhythm Narrow QRS tachycardia No ST elevation/depression  No STEMI.  Confirmed by Alona Bene [phone]) on 05/21/2017 12:23:10 PM       ____________________________________________  RADIOLOGY  Dg Chest 2 View  Result Date: 05/21/2017 CLINICAL DATA:  Shortness of breath and chest pain EXAM: CHEST - 2 VIEW COMPARISON:  January 18, 2015 FINDINGS: Lungs are clear. Heart size and pulmonary vascularity are normal. No adenopathy. 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 No pneumothorax. IMPRESSION: No edema or consolidation. Electronically Signed   By: Bretta Bang III M.D.   On: 05/21/2017 13:47    ____________________________________________   PROCEDURES  Procedure(s) performed:   Procedures  None ____________________________________________   INITIAL IMPRESSION / ASSESSMENT AND PLAN / ED COURSE  Pertinent labs & imaging results that were available during my care of the patient were reviewed by me and considered in my medical decision making (see chart for details).  Patient presents to the emergency department for evaluation of central chest heaviness starting this morning at 4 AM which has been intermittent over the last 8 hours.  No reproducible pain to palpation of the chest wall.  Patient does smoke cigarettes and has a grandmother who had a heart attack in her 22s. Patient's HEART score is 1. Low risk for PE by Wells and PERC negative. EKG reviewed with no acute ischemic changes. Labs and CXR pending.   12:45 PM Troponin (0.01) not crossing into Epic at this time but normal. ISTAT beta hCG also negative. Adding GI cocktail and Motrin 800 mg. Continue to follow remaining labs and CXR.   01:14 PM Remaining labs reviewed with no acute findings. Patient CP resolved after GI cocktail and Motrin. Discussed the importance of smoking cessation in detail. Patient will try to reduce and ultimately stop smoking. Provided the 1800 QUIT NOW phone number for assistance. CXR pending. If  negative, plan for discharge with Cardiology and PCP follow up.   01:55 PM CXR reviewed with no acute findings. Plan for discharge with plan as outlined above.   At this time, I do not feel there is any life-threatening condition present. I have reviewed and discussed all results (EKG, imaging, lab, urine as appropriate), exam findings with patient. I have reviewed nursing notes and appropriate previous records.  I feel the patient is safe to be discharged home without further emergent workup. Discussed usual and customary return precautions. Patient and family (if present) verbalize understanding and are comfortable with this plan.  Patient will follow-up with their primary care provider. If they do not have a primary care provider, information for follow-up has been provided to them. All questions have been answered.  ____________________________________________  FINAL CLINICAL IMPRESSION(S) / ED DIAGNOSES  Final diagnoses:  Precordial chest pain     MEDICATIONS GIVEN DURING THIS VISIT:  Medications  gi cocktail (Maalox,Lidocaine,Donnatal) (30 mLs Oral Given 05/21/17 1301)  ibuprofen (ADVIL,MOTRIN) tablet 800 mg (800 mg  Oral Given 05/21/17 1301)     NEW OUTPATIENT MEDICATIONS STARTED DURING THIS VISIT:  New Prescriptions   PANTOPRAZOLE (PROTONIX) 20 MG TABLET    Take 1 tablet (20 mg total) by mouth daily.   SUCRALFATE (CARAFATE) 1 G TABLET    Take 1 tablet (1 g total) by mouth 4 (four) times daily -  with meals and at bedtime for 14 days.    Note:  This document was prepared using Dragon voice recognition software and may include unintentional dictation errors.  Alona BeneJoshua Long, MD Emergency Medicine    Long, Arlyss RepressJoshua G, MD 05/21/17 1357

## 2017-05-21 NOTE — ED Notes (Signed)
BhCG result: <5.0  I-Stat cTnl result: 0.01

## 2017-06-02 ENCOUNTER — Ambulatory Visit: Payer: Medicaid Other | Admitting: Cardiovascular Disease

## 2017-06-03 ENCOUNTER — Encounter: Payer: Self-pay | Admitting: Cardiovascular Disease

## 2018-01-06 ENCOUNTER — Emergency Department (HOSPITAL_COMMUNITY)
Admission: EM | Admit: 2018-01-06 | Discharge: 2018-01-06 | Disposition: A | Discharge status: Home / Self Care | Payer: Medicaid Other | Attending: Emergency Medicine | Admitting: Emergency Medicine

## 2018-01-06 ENCOUNTER — Other Ambulatory Visit: Payer: Self-pay

## 2018-01-06 ENCOUNTER — Encounter (HOSPITAL_COMMUNITY): Payer: Self-pay

## 2018-01-06 DIAGNOSIS — F17210 Nicotine dependence, cigarettes, uncomplicated: Secondary | ICD-10-CM | POA: Insufficient documentation

## 2018-01-06 DIAGNOSIS — R519 Headache, unspecified: Secondary | ICD-10-CM

## 2018-01-06 DIAGNOSIS — R51 Headache: Secondary | ICD-10-CM | POA: Insufficient documentation

## 2018-01-06 LAB — I-STAT BETA HCG BLOOD, ED (MC, WL, AP ONLY)

## 2018-01-06 MED ORDER — KETOROLAC TROMETHAMINE 15 MG/ML IJ SOLN
15.0000 mg | Freq: Once | INTRAMUSCULAR | Status: AC
  Administered 2018-01-06: 15 mg via INTRAVENOUS
  Filled 2018-01-06: qty 1

## 2018-01-06 MED ORDER — SODIUM CHLORIDE 0.9 % IV BOLUS
1000.0000 mL | Freq: Once | INTRAVENOUS | Status: AC
  Administered 2018-01-06: 1000 mL via INTRAVENOUS

## 2018-01-06 MED ORDER — METOCLOPRAMIDE HCL 5 MG/ML IJ SOLN
10.0000 mg | Freq: Once | INTRAMUSCULAR | Status: AC
  Administered 2018-01-06: 10 mg via INTRAVENOUS
  Filled 2018-01-06: qty 2

## 2018-01-06 NOTE — ED Provider Notes (Signed)
[facility] EMERGENCY DEPARTMENT Provider Note   CSN: [id number] Arrival date & time: 01/06/18  1245     History   Chief Complaint Chief Complaint  Patient presents with  . Nausea  . Migraine    HPI Debbie Mercado is a 37 y.o. female presenting with a constant right sided headache onset yesterday morning. Patient describes it as a sharp pain and states she had one episode of vomiting yesterday. Patient reports nausea, but denies any vomiting today. Patient states she has not tried anything for the pain. Patient states nothing makes the pain better or worse. Patient denies abdominal pain, neck pain, vision changes, numbness, or weakness. Patient reports more stressful events in life. Patient denies fever, chills, or night sweats. Patient states she has not eaten anything today.    HPI  History reviewed. No pertinent past medical history.  There are no active problems to display for this patient.   Past Surgical History:  Procedure Laterality Date  . CESAREAN SECTION     times three  . HERNIA REPAIR    . HERNIA REPAIR       OB History    Gravida  3   Para  3   Term      Preterm      AB      Living  3     SAB      TAB      Ectopic      Multiple      Live Births  3            Home Medications    Prior to Admission medications   Medication Sig Start Date End Date Taking? Authorizing Provider  pantoprazole (PROTONIX) 20 MG tablet Take 1 tablet (20 mg total) by mouth daily. 05/21/17 06/20/17  Long, Arlyss Repress, MD  sucralfate (CARAFATE) 1 g tablet Take 1 tablet (1 g total) by mouth 4 (four) times daily -  with meals and at bedtime for 14 days. 05/21/17 06/04/17  Long, Arlyss Repress, MD    Family History Family History  Problem Relation Age of Onset  . Diabetes Mother     Social History Social History   Tobacco Use  . Smoking status: Current Every Day Smoker    Packs/day: 0.25    Years: 2.00    Pack years: 0.50    Types: Cigarettes  . Smokeless  tobacco: Never Used  Substance Use Topics  . Alcohol use: No  . Drug use: Yes    Types: Marijuana     Allergies   Fish allergy   Review of Systems Review of Systems  Constitutional: Negative for activity change, appetite change, chills, diaphoresis, fatigue, fever and unexpected weight change.  HENT: Negative for congestion, ear pain, sinus pressure and sore throat.   Eyes: Negative for photophobia and visual disturbance.  Respiratory: Negative for shortness of breath.   Cardiovascular: Negative for chest pain.  Gastrointestinal: Positive for nausea and vomiting. Negative for abdominal pain.  Musculoskeletal: Negative for gait problem, myalgias, neck pain and neck stiffness.  Allergic/Immunologic: Negative for immunocompromised state.  Neurological: Positive for headaches. Negative for dizziness, seizures, syncope, speech difficulty, weakness and numbness.  Hematological: Does not bruise/bleed easily.  Psychiatric/Behavioral: Negative for sleep disturbance. The patient is not nervous/anxious.      Physical Exam Updated Vital Signs BP (!) 154/87 (BP Location: Right Arm)   Pulse 66   Temp 98.2 F (36.8 C) (Oral)   Resp 16   Ht 5'  5" (1.651 m)   Wt 84.8 kg   LMP 12/22/2017   SpO2 100%   BMI 31.12 kg/m   Physical Exam  Constitutional: She is oriented to person, place, and time. She appears well-developed and well-nourished. No distress.  HENT:  Head: Normocephalic and atraumatic.  Cardiovascular: Normal rate, regular rhythm and normal heart sounds. Exam reveals no gallop and no friction rub.  No murmur heard. Pulmonary/Chest: Effort normal and breath sounds normal. No respiratory distress. She has no wheezes. She has no rales.  Musculoskeletal: Normal range of motion.  Neurological: She is alert and oriented to person, place, and time.  Skin: No rash noted. She is not diaphoretic. No erythema.  Psychiatric: She has a normal mood and affect.  Nursing note and vitals  reviewed.  Mental Status:  Alert, oriented, thought content appropriate, able to give a coherent history. Speech fluent without evidence of aphasia. Able to follow 2 step commands without difficulty.  Cranial Nerves:  II:  Peripheral visual fields grossly normal, pupils equal, round, reactive to light III,IV, VI: ptosis not present, extra-ocular motions intact bilaterally  V,VII: smile symmetric, facial light touch sensation equal VIII: hearing grossly normal to voice  X: uvula elevates symmetrically  XI: bilateral shoulder shrug symmetric and strong XII: midline tongue extension without fassiculations Motor:  Normal tone. 5/5 in upper and lower extremities bilaterally including strong and equal grip strength and dorsiflexion/plantar flexion Sensory: light touch normal in all extremities.  Cerebellar: normal finger-to-nose with bilateral upper extremities Gait: normal gait and balance.  CV: distal pulses palpable throughout    ED Treatments / Results  Labs (all labs ordered are listed, but only abnormal results are displayed) Labs Reviewed  I-STAT BETA HCG BLOOD, ED ([facility], [facility], [facility] ONLY)    EKG None  Radiology No results found.  Procedures Procedures (including critical care time)  Medications Ordered in ED Medications  ketorolac (TORADOL) 15 MG/ML injection 15 mg (15 mg Intravenous Given 01/06/18 1329)  metoCLOPramide (REGLAN) injection 10 mg (10 mg Intravenous Given 01/06/18 1329)  sodium chloride 0.9 % bolus 1,000 mL (1,000 mLs Intravenous New Bag/Given 01/06/18 1329)     Initial Impression / Assessment and Plan / ED Course  I have reviewed the triage vital signs and the nursing notes.  Pertinent labs & imaging results that were available during my care of the patient were reviewed by me and considered in my medical decision making (see chart for details).    Pt HA treated and improved while in ED with IVF, Toradol, and Reglan.  Presentation is like pts typical HA and  non concerning for [facility], ICH, Meningitis, or temporal arteritis. Pt is afebrile with no focal neuro deficits, nuchal rigidity, or change in vision. Pt is to follow up with PCP to discuss prophylactic medication. Pt verbalizes understanding and is agreeable with plan to dc.   Findings and plan of care discussed with supervising physician Dr. Hyacinth MeekerMiller who personally evaluated and examined this patient.    Final Clinical Impressions(s) / ED Diagnoses   Final diagnoses:  Right-sided headache    ED Discharge Orders    None       Leretha DykesHernandez, Anja Neuzil P, [place]PA-C 01/06/18 1435    Eber HongMiller, Brian, MD 01/06/18 2147

## 2018-01-06 NOTE — Discharge Instructions (Addendum)
You have been seen today for headache. Please read and follow all provided instructions.  ° °1. Medications:usual home medications °2. Treatment: rest, drink plenty of fluids °3. Follow Up: Please follow up with your primary doctor in 2 days for discussion of your diagnoses and further evaluation after today's visit; if you do not have a primary care doctor use the resource guide provided to find one; Please return to the ER for any new or worsening symptoms.  ° °Take medications as prescribed. Return to the emergency room for worsening condition or new concerning symptoms. Follow up with your regular doctor. If you don't have a regular doctor use one of the numbers below to establish a primary care doctor. ° ° °Emergency Department Resource Guide °1) Find a Doctor and Pay Out of Pocket °Although you won't have to find out who is covered by your insurance plan, it is a good idea to ask around and get recommendations. You will then need to call the office and see if the doctor you have chosen will accept you as a new patient and what types of options they offer for patients who are self-pay. Some doctors offer discounts or will set up payment plans for their patients who do not have insurance, but you will need to ask so you aren't surprised when you get to your appointment. ° °2) Contact Your Local Health Department °Not all health departments have doctors that can see patients for sick visits, but many do, so it is worth a call to see if yours does. If you don't know where your local health department is, you can check in your phone book. The CDC also has a tool to help you locate your state's health department, and many state websites also have listings of all of their local health departments. ° °3) Find a Walk-in Clinic °If your illness is not likely to be very severe or complicated, you may want to try a walk in clinic. These are popping up all over the country in pharmacies, drugstores, and shopping centers.  They're usually staffed by nurse practitioners or physician assistants that have been trained to treat common illnesses and complaints. They're usually fairly quick and inexpensive. However, if you have serious medical issues or chronic medical problems, these are probably not your best option. ° °No Primary Care Doctor: °Call [facility] at  832-8000 - they can help you locate a primary care doctor that  accepts your insurance, provides certain services, etc. °Physician Referral Service- 1-800-533-3463 ° °Emergency Department Resource Guide °1) Find a Doctor and Pay Out of Pocket °Although you won't have to find out who is covered by your insurance plan, it is a good idea to ask around and get recommendations. You will then need to call the office and see if the doctor you have chosen will accept you as a new patient and what types of options they offer for patients who are self-pay. Some doctors offer discounts or will set up payment plans for their patients who do not have insurance, but you will need to ask so you aren't surprised when you get to your appointment. ° °2) Contact Your Local Health Department °Not all health departments have doctors that can see patients for sick visits, but many do, so it is worth a call to see if yours does. If you don't know where your local health department is, you can check in your phone book. The CDC also has a tool to help you locate your state's   health department, and many state websites also have listings of all of their local health departments. ° °3) Find a Walk-in Clinic °If your illness is not likely to be very severe or complicated, you may want to try a walk in clinic. These are popping up all over the country in pharmacies, drugstores, and shopping centers. They're usually staffed by nurse practitioners or physician assistants that have been trained to treat common illnesses and complaints. They're usually fairly quick and inexpensive. However, if you have  serious medical issues or chronic medical problems, these are probably not your best option. ° °No Primary Care Doctor: °Call [facility] at  832-8000 - they can help you locate a primary care doctor that  accepts your insurance, provides certain services, etc. °Physician Referral Service- 1-800-533-3463 ° °Chronic Pain Problems: °Organization         Address  Phone   Notes  °[facility]  ([phone]) [phone] Patients need to be referred by their primary care doctor.  ° °Medication Assistance: °Organization         Address  Phone   Notes  °[place] [facility] [address] °[place], [place] [address] [phone]) [phone] --Must be a resident of [place] °-- Must have NO insurance coverage whatsoever (no Medicaid/ Medicare, etc.) °-- The pt. MUST have a primary care doctor that directs their care regularly and follows them in the community °  °[facility]  (866) 331-1348   °[employer]  (888) 892-1162   ° °Agencies that provide inexpensive medical care: °Organization         Address  Phone   Notes  °[facility]  ([phone]) [phone]   °[facility]    ([phone]) 832-7272   °[facility] [address] °[place], [place] [address] ([phone]) [phone]   °[facility] [address], °[place] ([phone]) [phone]   °[facility]    (336) 373-0678   °[facility]    (336) 272-1050   °[facility] ° [address], [place] Phone:  (336) [phone], Fax:  ([phone]) [phone] Hours of Operation:  9 am - 6 pm, M-F.  Also accepts Medicaid/Medicare and self-pay.  °[facility] ° [address], [place] Phone: (336) [phone], Fax: ([phone]) [phone]. Hours of Operation:  8:30 am - 5:30 pm, M-F.  Also accepts Medicaid and self-pay.  °[facility] [place] [address], [place] Phone: ([phone]) [phone]   °[facility] [address],  [place], [place] (336)[phone], Ext. 123 Mondays & Thursdays: 7-9 AM.  First 15 patients are seen on a first come, first serve basis. °  ° °Medicaid-accepting [place] Providers: ° °Organization         Address  Phone   Notes  °[facility] [address], [place] ([phone]) [phone] Also accepts self-pay patients.  °[facility] [address], [place] ° ([phone]) [phone]   °[facility] [address], [place] ([phone]) [phone]   °[facility] [address] [place] [address], [place] (336) [phone]   °[facility] [address], [place]  ° ([phone]) [phone] Only accepts [place] Access Medicaid patients after they have their name applied to their card.  ° °Self-Pay (no insurance) in [place]: ° °Organization           Address  Phone   Notes  °Sickle Cell Patients, [facility] [address], [place] ([phone]) [phone]   °[facility] [address], [place] ([phone]) [phone]   °[facility] [place] ° [address], [place] ([phone]) [phone]   °[facility]/Dr. Osei-Bonsu ° [address] [place] [address], [place] or [address], [place] [phone]) [phone] Phone number for both [place] and [place] locations is the same.  °[facility] [address], [place] ([phone]) [phone]   °[facility] [address] [place] [address], [place] or [address] (336) [phone] °(336) [phone]   °[facility] [address], [place] (336) [phone], phone; ([phone]) [phone], fax Sees patients 1st and 3rd Saturday of every month.  Must not qualify for public or private insurance (i.e. Medicaid, Medicare, [place] [employer] Choice, Veterans' Benefits)  Household income should be no more than 200% of the poverty level The clinic cannot treat you if you are pregnant or think you are pregnant  Sexually  transmitted diseases are not treated at the clinic.  °  ° °

## 2018-01-06 NOTE — ED Triage Notes (Signed)
Pt reports she has been nauseated and vomiting for 2 days. Dry heaves this morning. Pt reports right side of head began hurting this morning

## 2018-01-06 NOTE — ED Provider Notes (Signed)
Medical screening examination/treatment/procedure(s) were conducted as a shared visit with non-physician practitioner(s) and myself.  I personally evaluated the patient during the encounter.  Clinical Impression:   Final diagnoses:  Right-sided headache    Patient is well-appearing 37 year old female, no history of headache, started with some vomiting yesterday then developed a right frontal headache.  She has had no changes in vision numbness weakness chest pain shortness of breath coughing back pain dysuria diarrhea or rectal bleeding rashes swelling or any other complaints.  After getting some IV fluids she feels back to normal and has no complaints of either headache or nausea.  She was given medications and a headache cocktail which is totally improved her symptoms.  She has no prior history of headaches of any significance, I do not think she has anything pathologic based on her normal neurologic exam for me.  She appears well speaks clearly has normal coordination strength and speech.  Will refer to outpatient if she has ongoing symptoms, she will be given an antiemetic for home.  The patient expressed understanding to the indications for return and is in total agreement with the plan   Eber HongMiller, Caidence Higashi, MD 01/06/18 2148

## 2018-04-09 ENCOUNTER — Encounter (HOSPITAL_COMMUNITY): Payer: Self-pay | Admitting: Emergency Medicine

## 2018-04-09 ENCOUNTER — Emergency Department (HOSPITAL_COMMUNITY)
Admission: EM | Admit: 2018-04-09 | Discharge: 2018-04-09 | Disposition: A | Discharge status: Home / Self Care | Payer: Medicaid Other | Attending: Emergency Medicine | Admitting: Emergency Medicine

## 2018-04-09 ENCOUNTER — Other Ambulatory Visit: Payer: Self-pay

## 2018-04-09 DIAGNOSIS — F17210 Nicotine dependence, cigarettes, uncomplicated: Secondary | ICD-10-CM | POA: Insufficient documentation

## 2018-04-09 DIAGNOSIS — Z209 Contact with and (suspected) exposure to unspecified communicable disease: Secondary | ICD-10-CM | POA: Insufficient documentation

## 2018-04-09 DIAGNOSIS — R82998 Other abnormal findings in urine: Secondary | ICD-10-CM | POA: Insufficient documentation

## 2018-04-09 DIAGNOSIS — R112 Nausea with vomiting, unspecified: Secondary | ICD-10-CM | POA: Diagnosis not present

## 2018-04-09 DIAGNOSIS — Z79899 Other long term (current) drug therapy: Secondary | ICD-10-CM | POA: Insufficient documentation

## 2018-04-09 LAB — CBC WITH DIFFERENTIAL/PLATELET
Abs Immature Granulocytes: 0.02 K/uL (ref 0.00–0.07)
Basophils Absolute: 0 K/uL (ref 0.0–0.1)
Basophils Relative: 0 %
Eosinophils Absolute: 0.2 K/uL (ref 0.0–0.5)
Eosinophils Relative: 2 %
HCT: 41.5 % (ref 36.0–46.0)
Hemoglobin: 13.3 g/dL (ref 12.0–15.0)
Immature Granulocytes: 0 %
Lymphocytes Relative: 34 %
Lymphs Abs: 3 K/uL (ref 0.7–4.0)
MCH: 28.8 pg (ref 26.0–34.0)
MCHC: 32 g/dL (ref 30.0–36.0)
MCV: 89.8 fL (ref 80.0–100.0)
Monocytes Absolute: 0.7 K/uL (ref 0.1–1.0)
Monocytes Relative: 8 %
Neutro Abs: 5.1 K/uL (ref 1.7–7.7)
Neutrophils Relative %: 56 %
Platelets: 247 K/uL (ref 150–400)
RBC: 4.62 MIL/uL (ref 3.87–5.11)
RDW: 13.9 % (ref 11.5–15.5)
WBC: 9 K/uL (ref 4.0–10.5)
nRBC: 0 % (ref 0.0–0.2)

## 2018-04-09 LAB — URINALYSIS, ROUTINE W REFLEX MICROSCOPIC
Bilirubin Urine: NEGATIVE
Glucose, UA: NEGATIVE mg/dL
Ketones, ur: NEGATIVE mg/dL
Nitrite: NEGATIVE
Protein, ur: NEGATIVE mg/dL
Specific Gravity, Urine: 1.024 (ref 1.005–1.030)
pH: 5 (ref 5.0–8.0)

## 2018-04-09 LAB — COMPREHENSIVE METABOLIC PANEL
ALT: 15 U/L (ref 0–44)
ANION GAP: 8 (ref 5–15)
AST: 17 U/L (ref 15–41)
Albumin: 3.7 g/dL (ref 3.5–5.0)
Alkaline Phosphatase: 76 U/L (ref 38–126)
BUN: 7 mg/dL (ref 6–20)
CHLORIDE: 108 mmol/L (ref 98–111)
CO2: 22 mmol/L (ref 22–32)
CREATININE: 0.78 mg/dL (ref 0.44–1.00)
Calcium: 8.6 mg/dL — ABNORMAL LOW (ref 8.9–10.3)
Glucose, Bld: 99 mg/dL (ref 70–99)
POTASSIUM: 3.7 mmol/L (ref 3.5–5.1)
Sodium: 138 mmol/L (ref 135–145)
Total Bilirubin: 0.7 mg/dL (ref 0.3–1.2)
Total Protein: 7.2 g/dL (ref 6.5–8.1)

## 2018-04-09 LAB — LIPASE, BLOOD: Lipase: 28 U/L (ref 11–51)

## 2018-04-09 LAB — PREGNANCY, URINE: Preg Test, Ur: NEGATIVE

## 2018-04-09 MED ORDER — SODIUM CHLORIDE 0.9 % IV BOLUS
1000.0000 mL | Freq: Once | INTRAVENOUS | Status: AC
  Administered 2018-04-09: 1000 mL via INTRAVENOUS

## 2018-04-09 MED ORDER — ONDANSETRON 4 MG PO TBDP
4.0000 mg | ORAL_TABLET | Freq: Once | ORAL | Status: AC
  Administered 2018-04-09: 4 mg via ORAL
  Filled 2018-04-09: qty 1

## 2018-04-09 MED ORDER — PROMETHAZINE HCL 25 MG PO TABS: 12.5000 mg | ORAL_TABLET | Freq: Four times a day (QID) | ORAL | 0 refills | Status: DC | PRN

## 2018-04-09 MED ORDER — PROMETHAZINE HCL 25 MG/ML IJ SOLN
25.0000 mg | Freq: Once | INTRAMUSCULAR | Status: AC
  Administered 2018-04-09: 25 mg via INTRAVENOUS
  Filled 2018-04-09: qty 1

## 2018-04-09 NOTE — ED Notes (Addendum)
Patient continues to vomiting. Not tolerating fluids PO.

## 2018-04-09 NOTE — Discharge Instructions (Addendum)
As discussed your lab tests are okay today.  You may benefit from the BRAT diet for the next 24 hours then slowly advancing your food intake as tolerated.  This diet includes bananas, rice, applesauce and dry toast.  You may use the Phenergan if needed for continued nausea or vomiting.  Get rechecked for any persistent or worsening symptoms.

## 2018-04-09 NOTE — ED Triage Notes (Signed)
Onset yesterday nausea, vomited x 1 time after eating. Today just nausea, denies belly pain or diarrhea. Last period 1/27

## 2018-04-10 NOTE — ED Provider Notes (Signed)
[facility] EMERGENCY DEPARTMENT Provider Note   CSN: [id number] Arrival date & time: 04/09/18  [id number]    History   Chief Complaint Chief Complaint  Patient presents with  . Nausea    HPI Debbie Mercado is a 38 y.o. female with no significant past medical history presenting with a 24-hour history of nausea with emesis x1 which occurred yesterday.  She denies abdominal pain, no diarrhea, last BM was yesterday and normal and denies hematuria, dysuria back or flank pain..  She has had no fevers or chills.  She endorses exposure to a child who is currently experiencing similar symptoms.  She has had no medications for her symptoms prior to arrival.  Other pertinent negatives include negative weakness, chest pain, shortness of breath.  She has had no recent URI or flulike illnesses.     The history is provided by the patient.    History reviewed. No pertinent past medical history.  There are no active problems to display for this patient.   Past Surgical History:  Procedure Laterality Date  . CESAREAN SECTION     times three  . HERNIA REPAIR    . HERNIA REPAIR       OB History    Gravida  3   Para  3   Term      Preterm      AB      Living  3     SAB      TAB      Ectopic      Multiple      Live Births  3            Home Medications    Prior to Admission medications   Medication Sig Start Date End Date Taking? Authorizing Provider  pantoprazole (PROTONIX) 20 MG tablet Take 1 tablet (20 mg total) by mouth daily. 05/21/17 06/20/17  Long, Arlyss RepressJoshua G, MD  promethazine (PHENERGAN) 25 MG tablet Take 0.5-1 tablets (12.5-25 mg total) by mouth every 6 (six) hours as needed for nausea or vomiting. 04/09/18   Naijah Lacek, Raynelle FanningJulie, PA-C  sucralfate (CARAFATE) 1 g tablet Take 1 tablet (1 g total) by mouth 4 (four) times daily -  with meals and at bedtime for 14 days. 05/21/17 06/04/17  Long, Arlyss RepressJoshua G, MD    Family History Family History  Problem Relation Age of Onset  .  Diabetes Mother     Social History Social History   Tobacco Use  . Smoking status: Current Every Day Smoker    Packs/day: 0.25    Years: 2.00    Pack years: 0.50    Types: Cigarettes  . Smokeless tobacco: Never Used  Substance Use Topics  . Alcohol use: No  . Drug use: Yes    Types: Marijuana     Allergies   Fish allergy   Review of Systems Review of Systems  Constitutional: Negative for chills and fever.  HENT: Negative for congestion and sore throat.   Eyes: Negative.   Respiratory: Negative for chest tightness and shortness of breath.   Cardiovascular: Negative for chest pain.  Gastrointestinal: Positive for nausea and vomiting. Negative for abdominal pain, blood in stool, constipation and diarrhea.  Genitourinary: Negative.  Negative for dysuria.  Musculoskeletal: Negative for arthralgias, joint swelling and neck pain.  Skin: Negative.  Negative for rash and wound.  Neurological: Negative for dizziness, weakness, light-headedness, numbness and headaches.  Psychiatric/Behavioral: Negative.      Physical Exam Updated Vital Signs BP 130/78 (  BP Location: Right Arm)   Pulse 64   Temp 98.1 F (36.7 C) (Oral)   Resp 18   Ht 5\' 5"  (1.651 m)   Wt 84.8 kg   SpO2 100%   BMI 31.12 kg/m   Physical Exam Vitals signs and nursing note reviewed.  Constitutional:      Appearance: She is well-developed.  HENT:     Head: Normocephalic and atraumatic.  Eyes:     Conjunctiva/sclera: Conjunctivae normal.  Neck:     Musculoskeletal: Normal range of motion.  Cardiovascular:     Rate and Rhythm: Normal rate and regular rhythm.     Heart sounds: Normal heart sounds.  Pulmonary:     Effort: Pulmonary effort is normal.     Breath sounds: Normal breath sounds. No wheezing.  Abdominal:     General: Bowel sounds are normal. There is no distension.     Palpations: Abdomen is soft. There is no mass.     Tenderness: There is no abdominal tenderness. There is no guarding or  rebound.  Musculoskeletal: Normal range of motion.  Skin:    General: Skin is warm and dry.  Neurological:     Mental Status: She is alert.      ED Treatments / Results  Labs (all labs ordered are listed, but only abnormal results are displayed) Labs Reviewed  COMPREHENSIVE METABOLIC PANEL - Abnormal; Notable for the following components:      Result Value   Calcium 8.6 (*)    All other components within normal limits  URINALYSIS, ROUTINE W REFLEX MICROSCOPIC - Abnormal; Notable for the following components:   APPearance CLOUDY (*)    Hgb urine dipstick SMALL (*)    Leukocytes,Ua MODERATE (*)    Bacteria, UA RARE (*)    All other components within normal limits  URINE CULTURE  PREGNANCY, URINE  LIPASE, BLOOD  CBC WITH DIFFERENTIAL/PLATELET    EKG None  Radiology No results found.  Procedures Procedures (including critical care time)  Medications Ordered in ED Medications  ondansetron (ZOFRAN-ODT) disintegrating tablet 4 mg (4 mg Oral Given 04/09/18 1219)  sodium chloride 0.9 % bolus 1,000 mL (0 mLs Intravenous Stopped 04/09/18 1352)  promethazine (PHENERGAN) injection 25 mg (25 mg Intravenous Given 04/09/18 1249)     Initial Impression / Assessment and Plan / ED Course  I have reviewed the triage vital signs and the nursing notes.  Pertinent labs & imaging results that were available during my care of the patient were reviewed by me and considered in my medical decision making (see chart for details).        Patient's labs reviewed and discussed with patient.  She has no urinary symptoms, she does have a leukocytosis in her urine, but no other evidence of infection, urine culture was sent.  She was given an oral trial of fluids and had an episode of vomiting.  She was therefore given an IV saline bolus with IV Phenergan and her symptoms improved.  The plan was to repeat a p.o. challenge, but patient decided to leave before undergoing this test, stating she had a  child she had to pick up.  She was prescribed Phenergan, advised recheck by her PCP or return here for any persistent or worsening symptoms.  Final Clinical Impressions(s) / ED Diagnoses   Final diagnoses:  Non-intractable vomiting with nausea, unspecified vomiting type    ED Discharge Orders         Ordered    promethazine (PHENERGAN) 25  MG tablet  Every 6 hours PRN     04/09/18 1352           Victoriano Lain 04/10/18 1730    Bethann Berkshire, MD 04/11/18 [phone]

## 2018-04-11 LAB — URINE CULTURE

## 2018-12-11 ENCOUNTER — Encounter (HOSPITAL_COMMUNITY): Payer: Self-pay | Admitting: Emergency Medicine

## 2018-12-11 ENCOUNTER — Other Ambulatory Visit: Payer: Self-pay

## 2018-12-11 ENCOUNTER — Emergency Department (HOSPITAL_COMMUNITY): Payer: No Typology Code available for payment source

## 2018-12-11 ENCOUNTER — Emergency Department (HOSPITAL_COMMUNITY)
Admission: EM | Admit: 2018-12-11 | Discharge: 2018-12-11 | Disposition: A | Discharge status: Home / Self Care | Payer: No Typology Code available for payment source | Attending: Emergency Medicine | Admitting: Emergency Medicine

## 2018-12-11 DIAGNOSIS — T148XXA Other injury of unspecified body region, initial encounter: Secondary | ICD-10-CM

## 2018-12-11 DIAGNOSIS — Y939 Activity, unspecified: Secondary | ICD-10-CM | POA: Diagnosis not present

## 2018-12-11 DIAGNOSIS — Y929 Unspecified place or not applicable: Secondary | ICD-10-CM | POA: Insufficient documentation

## 2018-12-11 DIAGNOSIS — F1210 Cannabis abuse, uncomplicated: Secondary | ICD-10-CM | POA: Insufficient documentation

## 2018-12-11 DIAGNOSIS — F17210 Nicotine dependence, cigarettes, uncomplicated: Secondary | ICD-10-CM | POA: Insufficient documentation

## 2018-12-11 DIAGNOSIS — S46911A Strain of unspecified muscle, fascia and tendon at shoulder and upper arm level, right arm, initial encounter: Secondary | ICD-10-CM | POA: Diagnosis not present

## 2018-12-11 DIAGNOSIS — Y999 Unspecified external cause status: Secondary | ICD-10-CM | POA: Diagnosis not present

## 2018-12-11 DIAGNOSIS — S4991XA Unspecified injury of right shoulder and upper arm, initial encounter: Secondary | ICD-10-CM | POA: Diagnosis present

## 2018-12-11 MED ORDER — CYCLOBENZAPRINE HCL 10 MG PO TABS: 5.0000 mg | ORAL_TABLET | Freq: Two times a day (BID) | ORAL | 0 refills | Status: DC | PRN

## 2018-12-11 MED ORDER — CELECOXIB 100 MG PO CAPS: 100.0000 mg | ORAL_CAPSULE | Freq: Two times a day (BID) | ORAL | 0 refills | Status: DC
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
## 2018-12-11 NOTE — Discharge Instructions (Addendum)

## 2019-02-21 IMAGING — DX DG CHEST 2V
2 series · 2 of 2 positions shown · non-contrast
Comparison: January 18, 2015

CLINICAL DATA: Shortness of breath and chest pain

EXAM:
CHEST - 2 VIEW

[chest pa]
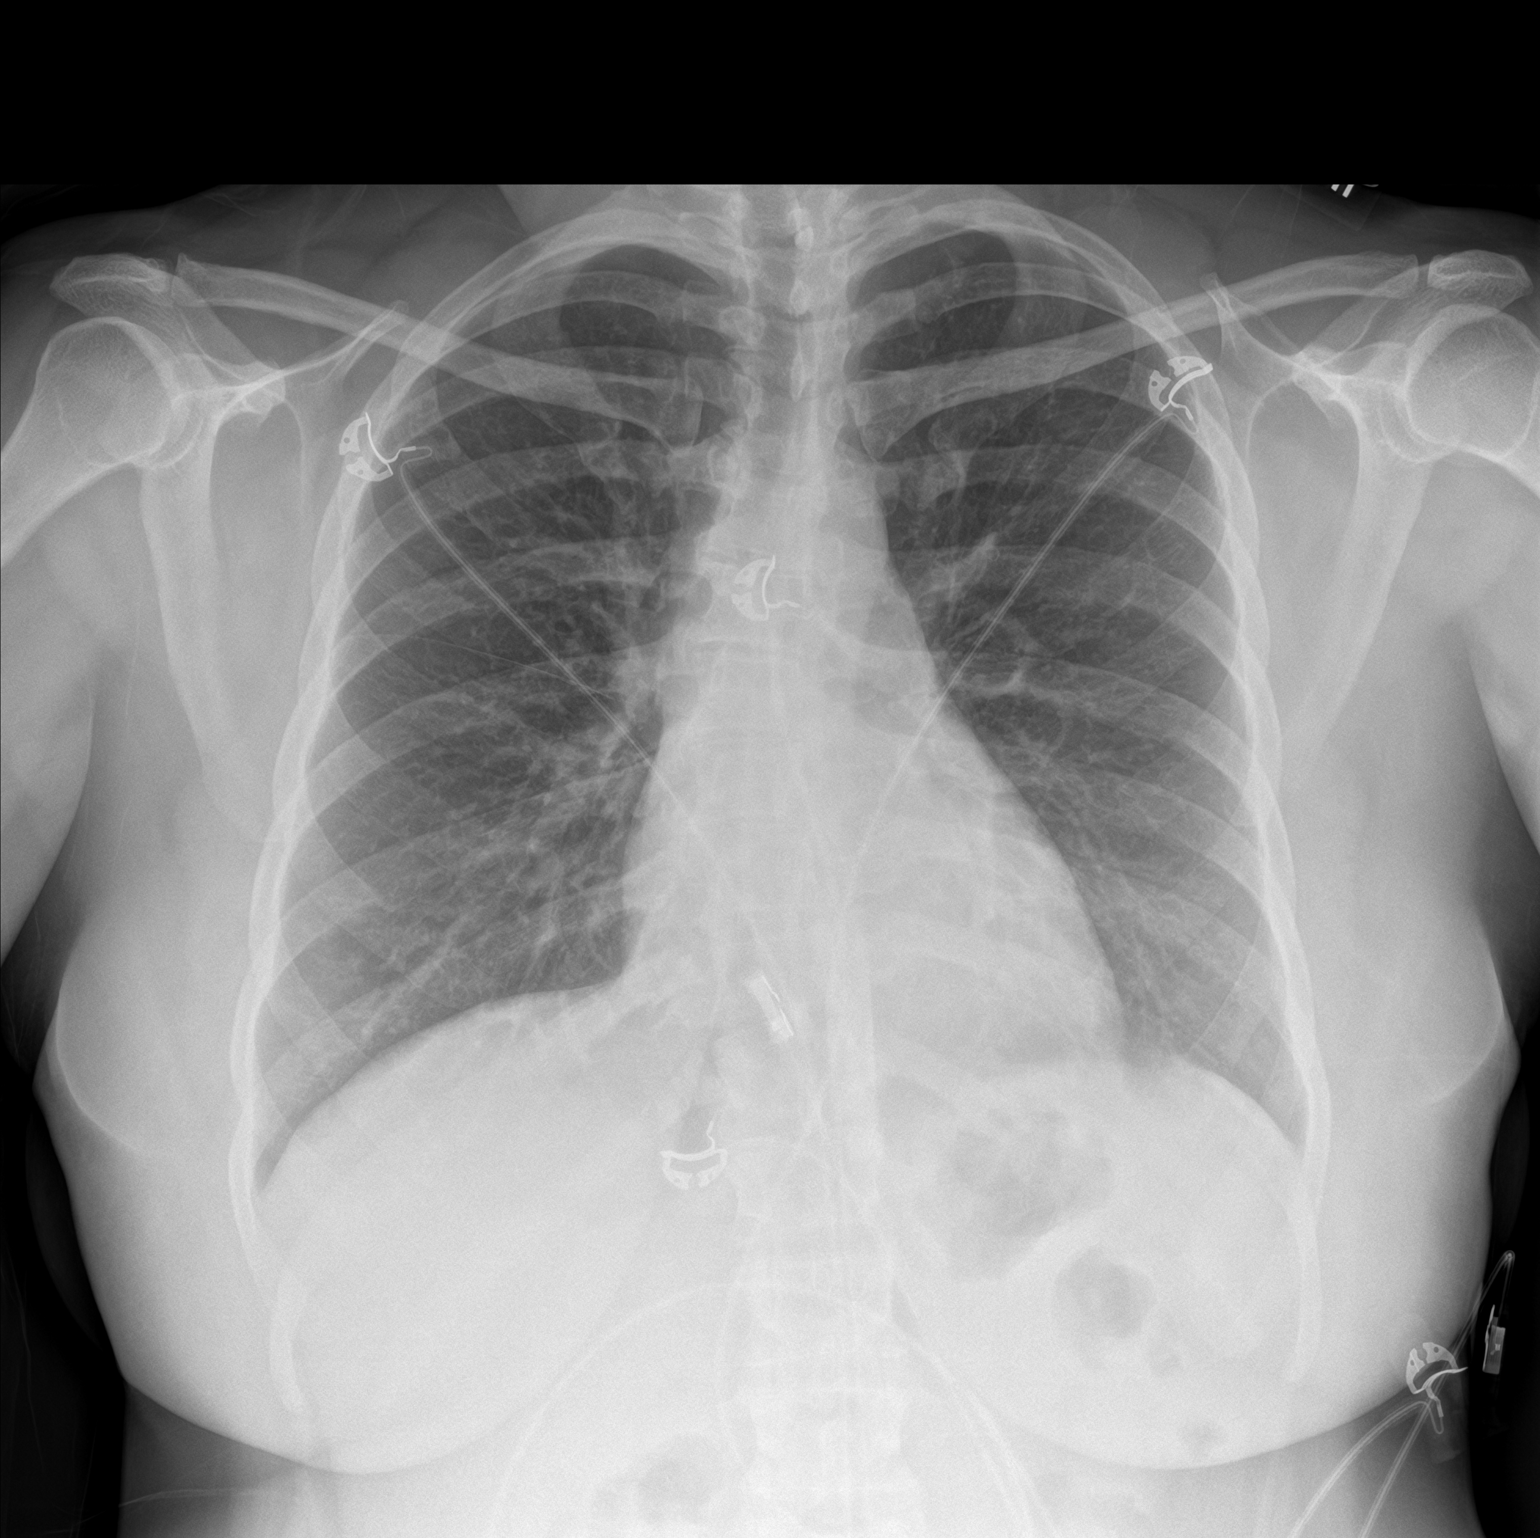

[chest lat]
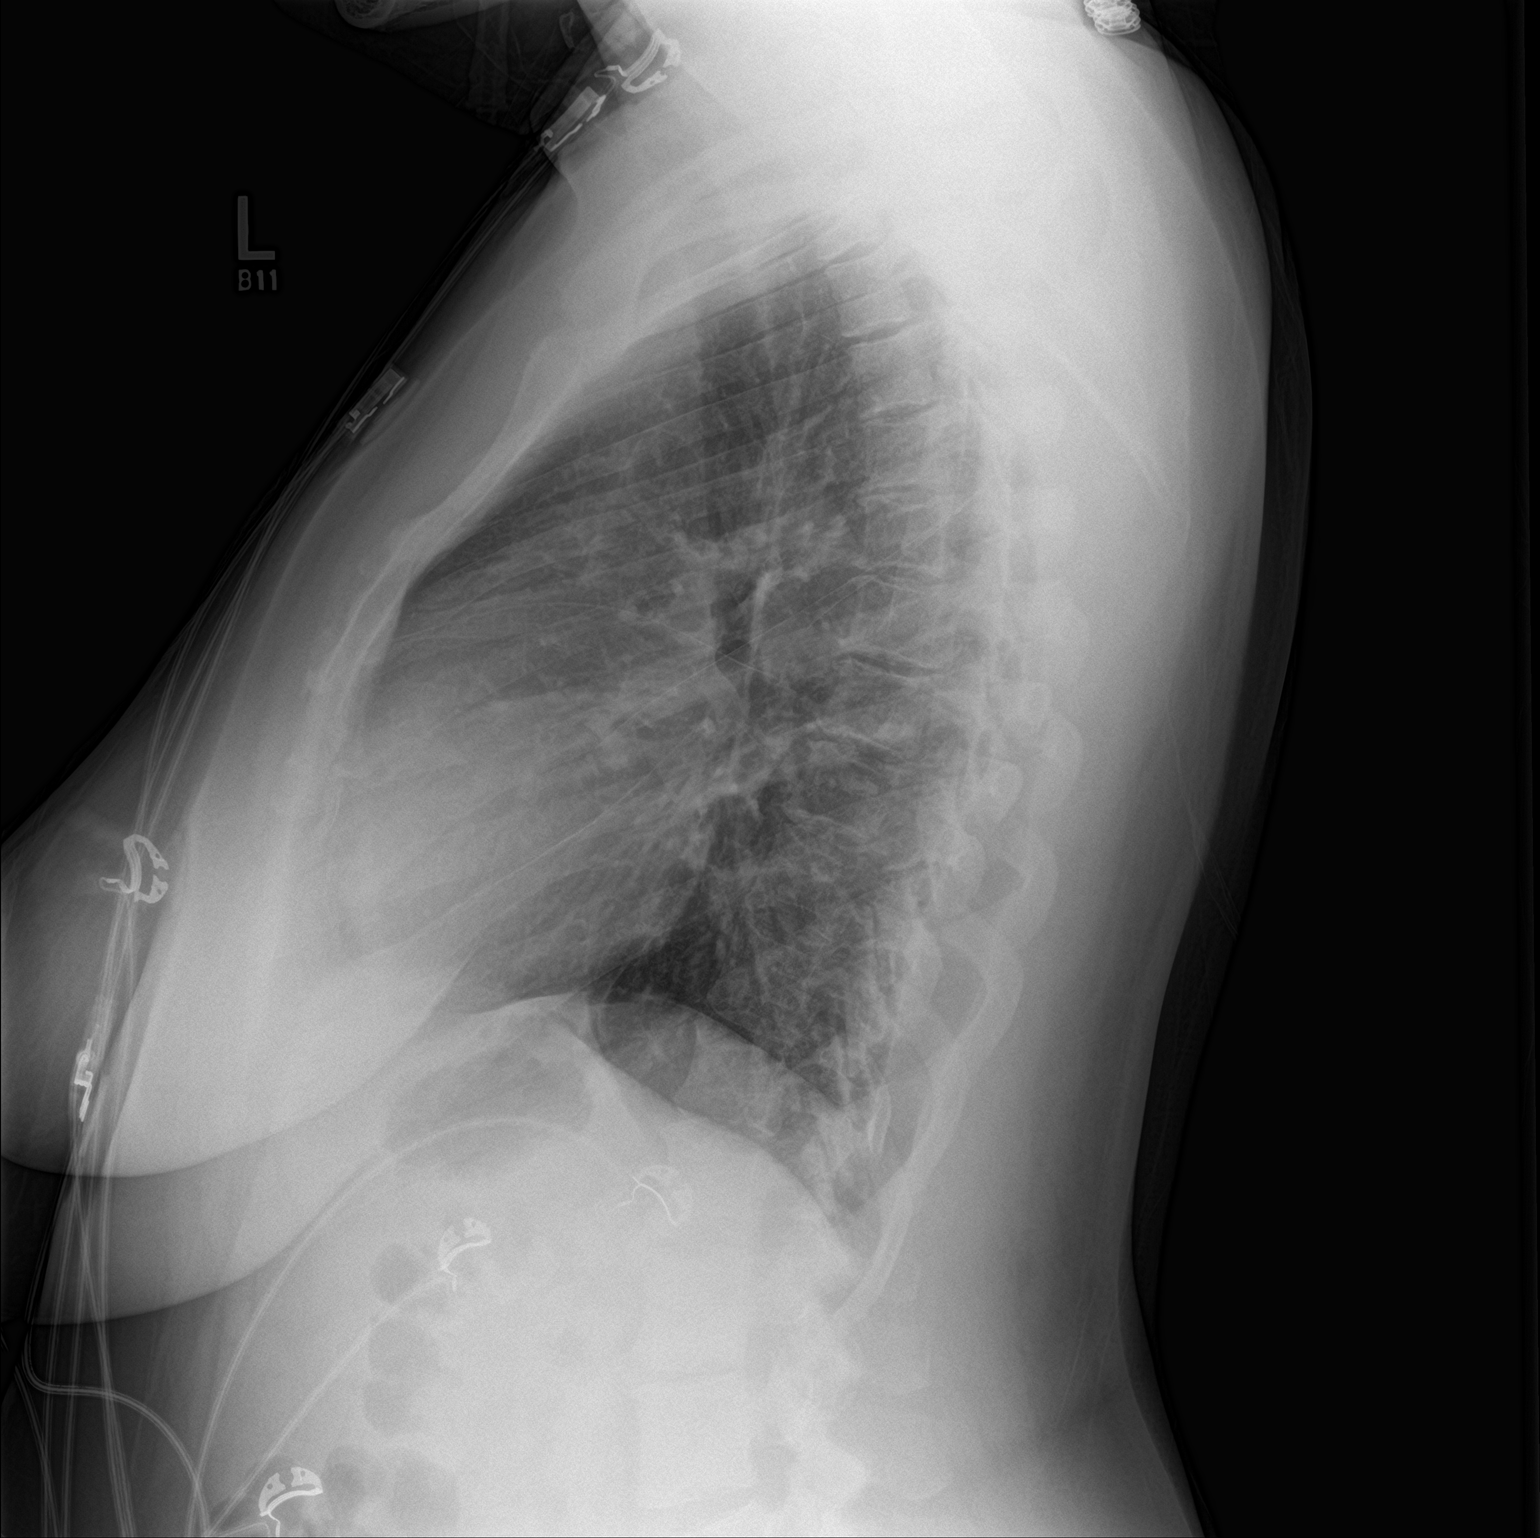

[2 of 2 positions shown; findings below may reference images not displayed]

FINDINGS: Lungs are clear. Heart size and pulmonary vascularity are normal. No
adenopathy. There is midthoracic dextroscoliosis. No pneumothorax.
IMPRESSION: No edema or consolidation.

## 2020-01-10 ENCOUNTER — Other Ambulatory Visit: Payer: Self-pay

## 2020-01-10 ENCOUNTER — Emergency Department (HOSPITAL_COMMUNITY)
Admission: EM | Admit: 2020-01-10 | Discharge: 2020-01-10 | Disposition: A | Discharge status: Home / Self Care | Payer: Medicaid Other | Attending: Emergency Medicine | Admitting: Emergency Medicine

## 2020-01-10 ENCOUNTER — Encounter (HOSPITAL_COMMUNITY): Payer: Self-pay

## 2020-01-10 DIAGNOSIS — K047 Periapical abscess without sinus: Secondary | ICD-10-CM | POA: Diagnosis not present

## 2020-01-10 DIAGNOSIS — Z79899 Other long term (current) drug therapy: Secondary | ICD-10-CM | POA: Diagnosis not present

## 2020-01-10 DIAGNOSIS — K0889 Other specified disorders of teeth and supporting structures: Secondary | ICD-10-CM | POA: Diagnosis present

## 2020-01-10 DIAGNOSIS — F17210 Nicotine dependence, cigarettes, uncomplicated: Secondary | ICD-10-CM | POA: Diagnosis not present

## 2020-01-10 MED ORDER — CLINDAMYCIN HCL 300 MG PO CAPS: 300.0000 mg | ORAL_CAPSULE | Freq: Three times a day (TID) | ORAL | 0 refills | Status: AC

## 2020-01-10 MED ORDER — DICLOFENAC SODIUM 75 MG PO TBEC: 75.0000 mg | DELAYED_RELEASE_TABLET | Freq: Two times a day (BID) | ORAL | 0 refills | Status: AC

## 2020-01-10 NOTE — Discharge Instructions (Signed)
See the Dentist for evaluation

## 2020-01-10 NOTE — ED Triage Notes (Signed)
Pt reports she has a bottom right wisdom tooth that is chipped. She has a dentist apt on Dec 1 but states she cannot tolerate the pain. Took aleve PTA.

## 2020-01-10 NOTE — ED Provider Notes (Signed)
[facility] EMERGENCY DEPARTMENT Provider Note   CSN: [id number] Arrival date & time: 01/10/20  0747     History Chief Complaint  Patient presents with  . Dental Pain    Debbie Mercado is a 39 y.o. female.  The history is provided by the patient. No language interpreter was used.  Dental Pain Location:  Upper Quality:  Aching Severity:  Moderate Duration:  2 weeks Timing:  Constant Progression:  Worsening Chronicity:  New Relieved by:  Nothing Worsened by:  Nothing Ineffective treatments:  None tried Associated symptoms: no fever   Pt complains of pain in upper mouth      History reviewed. No pertinent past medical history.  There are no problems to display for this patient.   Past Surgical History:  Procedure Laterality Date  . CESAREAN SECTION     times three  . HERNIA REPAIR    . HERNIA REPAIR       OB History    Gravida  3   Para  3   Term      Preterm      AB      Living  3     SAB      TAB      Ectopic      Multiple      Live Births  3           Family History  Problem Relation Age of Onset  . Diabetes Mother     Social History   Tobacco Use  . Smoking status: Current Every Day Smoker    Packs/day: 0.25    Years: 2.00    Pack years: 0.50    Types: Cigarettes  . Smokeless tobacco: Never Used  Substance Use Topics  . Alcohol use: No  . Drug use: Yes    Types: Marijuana    Home Medications Prior to Admission medications   Medication Sig Start Date End Date Taking? Authorizing Provider  clindamycin (CLEOCIN) 300 MG capsule Take 1 capsule (300 mg total) by mouth 3 (three) times daily for 10 days. 01/10/20 01/20/20  Elson Areas, PA-C  diclofenac (VOLTAREN) 75 MG EC tablet Take 1 tablet (75 mg total) by mouth 2 (two) times daily. 01/10/20   Elson Areas, PA-C    Allergies    Fish allergy  Review of Systems   Review of Systems  Constitutional: Negative for fever.  All other systems reviewed and are  negative.   Physical Exam Updated Vital Signs BP (!) 142/82   Pulse 75   Temp 98.2 F (36.8 C) (Oral)   Resp 20   Ht 5\' 3"  (1.6 m)   Wt 86 kg   SpO2 98%   BMI 33.59 kg/m   Physical Exam Vitals and nursing note reviewed.  Constitutional:      Appearance: She is well-developed.  HENT:     Head: Normocephalic.     Comments: Broken tooth upper molar,  Cardiovascular:     Rate and Rhythm: Normal rate.  Pulmonary:     Effort: Pulmonary effort is normal.  Abdominal:     General: There is no distension.  Musculoskeletal:        General: Normal range of motion.     Cervical back: Normal range of motion.  Skin:    General: Skin is warm.  Neurological:     General: No focal deficit present.     Mental Status: She is alert and oriented to person, place, and  time.  Psychiatric:        Mood and Affect: Mood normal.     ED Results / Procedures / Treatments   Labs (all labs ordered are listed, but only abnormal results are displayed) Labs Reviewed - No data to display  EKG None  Radiology No results found.  Procedures Procedures (including critical care time)  Medications Ordered in ED Medications - No data to display  ED Course  I have reviewed the triage vital signs and the nursing notes.  Pertinent labs & imaging results that were available during my care of the patient were reviewed by me and considered in my medical decision making (see chart for details).    MDM Rules/Calculators/A&P                          MDM:  Pt advised to follow up with her dentist.  Final Clinical Impression(s) / ED Diagnoses Final diagnoses:  Dental infection    Rx / DC Orders ED Discharge Orders         Ordered    diclofenac (VOLTAREN) 75 MG EC tablet  2 times daily        01/10/20 0936    clindamycin (CLEOCIN) 300 MG capsule  3 times daily        01/10/20 4235        An After Visit Summary was printed and given to the patient.   Elson Areas, [place] 01/10/20  1030    Margarita Grizzle, MD 01/11/20 1235

## 2022-01-05 ENCOUNTER — Emergency Department (HOSPITAL_COMMUNITY)
Admission: EM | Admit: 2022-01-05 | Discharge: 2022-01-05 | Disposition: A | Discharge status: Home / Self Care | Payer: Medicaid Other | Attending: Emergency Medicine | Admitting: Emergency Medicine

## 2022-01-05 ENCOUNTER — Other Ambulatory Visit: Payer: Self-pay

## 2022-01-05 ENCOUNTER — Encounter (HOSPITAL_COMMUNITY): Payer: Self-pay

## 2022-01-05 DIAGNOSIS — B349 Viral infection, unspecified: Secondary | ICD-10-CM | POA: Insufficient documentation

## 2022-01-05 DIAGNOSIS — Z20822 Contact with and (suspected) exposure to covid-19: Secondary | ICD-10-CM | POA: Diagnosis not present

## 2022-01-05 DIAGNOSIS — R509 Fever, unspecified: Secondary | ICD-10-CM | POA: Diagnosis present

## 2022-01-05 LAB — RESP PANEL BY RT-PCR (FLU A&B, COVID) ARPGX2
Influenza A by PCR: NEGATIVE
Influenza B by PCR: NEGATIVE
SARS Coronavirus 2 by RT PCR: NEGATIVE

## 2022-01-05 MED ORDER — ACETAMINOPHEN 500 MG PO TABS
1000.0000 mg | ORAL_TABLET | Freq: Once | ORAL | Status: AC
  Administered 2022-01-05: 1000 mg via ORAL
  Filled 2022-01-05: qty 2

## 2022-01-05 MED ORDER — ACETAMINOPHEN 500 MG PO TABS: 500.0000 mg | ORAL_TABLET | Freq: Four times a day (QID) | ORAL | 0 refills | Status: AC | PRN

## 2022-01-05 MED ORDER — BENZONATATE 100 MG PO CAPS: 100.0000 mg | ORAL_CAPSULE | Freq: Three times a day (TID) | ORAL | 0 refills | Status: AC

## 2022-01-05 NOTE — ED Triage Notes (Signed)
Pt reports flu like symptoms that started this morning. Temp 99.8 in triage. Thinks she may have picked up virus from Aunt.

## 2022-01-05 NOTE — ED Provider Notes (Signed)
[facility] EMERGENCY DEPARTMENT Provider Note   CSN: [id number] Arrival date & time: 01/05/22  1824     History  Chief Complaint  Patient presents with   flu like symptoms    Debbie Mercado is a 41 y.o. female.  The history is provided by the patient and medical records. No language interpreter was used.     41 year old female who presents with cold symptoms.  Patient reports he woke up this morning with fever chills body aches congestion cough sore throat and decrease in appetite.  She took a home COVID test which came back negative.  She however voiced concerns that she may have COVID.  She was recently had sick contact with her aunt with similar symptoms.  She denies any significant shortness of breath nausea vomiting diarrhea.  Home Medications Prior to Admission medications   Medication Sig Start Date End Date Taking? Authorizing Provider  diclofenac (VOLTAREN) 75 MG EC tablet Take 1 tablet (75 mg total) by mouth 2 (two) times daily. 01/10/20   Elson Areas, PA-C      Allergies    Fish allergy    Review of Systems   Review of Systems  All other systems reviewed and are negative.   Physical Exam Updated Vital Signs BP (!) 151/81 (BP Location: Right Arm)   Pulse 80   Temp 99.8 F (37.7 C) (Oral)   Resp 18   Ht 5\' 4"  (1.626 m)   Wt 89.4 kg   LMP 01/01/2022 (Exact Date)   SpO2 100%   BMI 33.81 kg/m  Physical Exam Vitals and nursing note reviewed.  Constitutional:      General: She is not in acute distress.    Appearance: She is well-developed.  HENT:     Head: Atraumatic.     Nose: Nose normal.     Mouth/Throat:     Mouth: Mucous membranes are moist.  Eyes:     Conjunctiva/sclera: Conjunctivae normal.  Cardiovascular:     Rate and Rhythm: Normal rate and regular rhythm.     Pulses: Normal pulses.     Heart sounds: Normal heart sounds.  Pulmonary:     Effort: Pulmonary effort is normal.     Breath sounds: No wheezing, rhonchi or rales.   Abdominal:     Palpations: Abdomen is soft.  Musculoskeletal:     Cervical back: Normal range of motion and neck supple. No rigidity.  Lymphadenopathy:     Cervical: No cervical adenopathy.  Skin:    Findings: No rash.  Neurological:     Mental Status: She is alert.  Psychiatric:        Mood and Affect: Mood normal.     ED Results / Procedures / Treatments   Labs (all labs ordered are listed, but only abnormal results are displayed) Labs Reviewed  RESP PANEL BY RT-PCR (FLU A&B, COVID) [id number]    EKG None  Radiology No results found.  Procedures Procedures    Medications Ordered in ED Medications  acetaminophen (TYLENOL) tablet 1,000 mg (has no administration in time range)    ED Course/ Medical Decision Making/ A&P                           Medical Decision Making Risk OTC drugs.   BP (!) 151/81 (BP Location: Right Arm)   Pulse 80   Temp 99.8 F (37.7 C) (Oral)   Resp 18   Ht 5\' 4"  (1.626 m)  Wt 89.4 kg   LMP 01/01/2022 (Exact Date)   SpO2 100%   BMI 33.81 kg/m   41:86 PM 41 year old female who presents with cold symptoms.  Patient reports he woke up this morning with fever chills body aches congestion cough sore throat and decrease in appetite.  She took a home COVID test which came back negative.  She however voiced concerns that she may have COVID.  She was recently had sick contact with her aunt with similar symptoms.  She denies any significant shortness of breath nausea vomiting diarrhea.  On exam this is a well-appearing female resting comfortably appears to be in no acute discomfort.  Vital signs remarkable for an oral temperature of 99.8, blood pressure elevated at 151/81, no hypoxia with an O2 sats of 100%.  Patient voiced concern for potential COVID-19 infection.  Differential diagnosis include flu, pneumonia, viral illness.  -treatment includes OTC meds -PCP office notes or outside notes reviewed -Prescription medication considered,  patient comfortable with OTC meds -Social Determinant of Health considered which includes tobacco use, recommend cessation Patient is of low risk however I recommend to self quarantine for 7 days from the onset of symptoms and 3 days after fever breaks.  Work note provided.         Final Clinical Impression(s) / ED Diagnoses Final diagnoses:  Viral illness    Rx / DC Orders ED Discharge Orders          Ordered    acetaminophen (TYLENOL) 500 MG tablet  Every 6 hours PRN        01/05/22 2243    benzonatate (TESSALON) 100 MG capsule  Every 8 hours        01/05/22 2243              Fayrene Helper, PA-C 01/05/22 2244    Derwood Kaplan, MD 01/05/22 2333

## 2022-06-08 ENCOUNTER — Emergency Department (HOSPITAL_COMMUNITY)
Admission: EM | Admit: 2022-06-08 | Discharge: 2022-06-08 | Disposition: A | Discharge status: Home / Self Care | Payer: Medicaid Other | Attending: Emergency Medicine | Admitting: Emergency Medicine

## 2022-06-08 ENCOUNTER — Encounter (HOSPITAL_COMMUNITY): Payer: Self-pay

## 2022-06-08 ENCOUNTER — Other Ambulatory Visit: Payer: Self-pay

## 2022-06-08 DIAGNOSIS — R112 Nausea with vomiting, unspecified: Secondary | ICD-10-CM | POA: Diagnosis not present

## 2022-06-08 DIAGNOSIS — R197 Diarrhea, unspecified: Secondary | ICD-10-CM | POA: Insufficient documentation

## 2022-06-08 LAB — URINALYSIS, ROUTINE W REFLEX MICROSCOPIC
Bacteria, UA: NONE SEEN
Bilirubin Urine: NEGATIVE
Glucose, UA: NEGATIVE mg/dL
Ketones, ur: NEGATIVE mg/dL
Nitrite: NEGATIVE
Protein, ur: NEGATIVE mg/dL
Specific Gravity, Urine: 1.018 (ref 1.005–1.030)
pH: 5 (ref 5.0–8.0)

## 2022-06-08 LAB — POC URINE PREG, ED: Preg Test, Ur: NEGATIVE

## 2022-06-08 LAB — CBC
HCT: 43.5 % (ref 36.0–46.0)
Hemoglobin: 14.5 g/dL (ref 12.0–15.0)
MCH: 29.1 pg (ref 26.0–34.0)
MCHC: 33.3 g/dL (ref 30.0–36.0)
MCV: 87.3 fL (ref 80.0–100.0)
Platelets: 287 10*3/uL (ref 150–400)
RBC: 4.98 MIL/uL (ref 3.87–5.11)
RDW: 14.1 % (ref 11.5–15.5)
WBC: 12.4 10*3/uL — ABNORMAL HIGH (ref 4.0–10.5)
nRBC: 0 % (ref 0.0–0.2)

## 2022-06-08 LAB — COMPREHENSIVE METABOLIC PANEL
ALT: 23 U/L (ref 0–44)
AST: 23 U/L (ref 15–41)
Albumin: 3.9 g/dL (ref 3.5–5.0)
Alkaline Phosphatase: 83 U/L (ref 38–126)
Anion gap: 9 (ref 5–15)
BUN: 10 mg/dL (ref 6–20)
CO2: 22 mmol/L (ref 22–32)
Calcium: 9.1 mg/dL (ref 8.9–10.3)
Chloride: 105 mmol/L (ref 98–111)
Creatinine, Ser: 0.81 mg/dL (ref 0.44–1.00)
GFR, Estimated: >60 mL/min (ref >60)
Glucose, Bld: 100 mg/dL — ABNORMAL HIGH (ref 70–99)
Potassium: 4.3 mmol/L (ref 3.5–5.1)
Sodium: 136 mmol/L (ref 135–145)
Total Bilirubin: 1 mg/dL (ref 0.3–1.2)
Total Protein: 7.9 g/dL (ref 6.5–8.1)

## 2022-06-08 LAB — LIPASE, BLOOD: Lipase: 31 U/L (ref 11–51)

## 2022-06-08 MED ORDER — ONDANSETRON 4 MG PO TBDP
4.0000 mg | ORAL_TABLET | Freq: Once | ORAL | Status: AC | PRN
  Administered 2022-06-08: 4 mg via ORAL
  Filled 2022-06-08: qty 1

## 2022-06-08 MED ORDER — SODIUM CHLORIDE 0.9 % IV BOLUS
1000.0000 mL | Freq: Once | INTRAVENOUS | Status: AC
  Administered 2022-06-08: 1000 mL via INTRAVENOUS

## 2022-06-08 MED ORDER — ACETAMINOPHEN 325 MG PO TABS: 650.0000 mg | ORAL_TABLET | Freq: Four times a day (QID) | ORAL | 0 refills | Status: AC | PRN

## 2022-06-08 MED ORDER — ONDANSETRON 4 MG PO TBDP: 4.0000 mg | ORAL_TABLET | Freq: Three times a day (TID) | ORAL | 0 refills | Status: AC | PRN

## 2022-06-08 MED ORDER — LOPERAMIDE HCL 2 MG PO CAPS: 2.0000 mg | ORAL_CAPSULE | Freq: Four times a day (QID) | ORAL | 0 refills | Status: AC | PRN

## 2022-06-08 NOTE — ED Notes (Signed)
Patient aware we need urine sample, unable to provide at this time.

## 2022-06-08 NOTE — ED Provider Notes (Signed)
[facility] EMERGENCY DEPARTMENT AT [facility] Provider Note   CSN: [id number] Arrival date & time: 06/08/22  1112     History  Chief Complaint  Patient presents with   Emesis    Debbie Mercado is a 42 y.o. female otherwise healthy presents today for evaluation of nausea, vomiting, diarrhea.  Patient states she woke up this morning feeling nauseated followed by vomiting.  She also reports nonbloody diarrhea.  She denies any cough, runny nose, chest pain, shortness of breath, fever, urinary symptoms, abnormal vaginal discharge.  She denies any abdominal pain.  No history of abdominal surgery.  No known sick contact.  Denies any recent antibiotics or recent travel.   Emesis    No past medical history on file. Past Surgical History:  Procedure Laterality Date   CESAREAN SECTION     times three   HERNIA REPAIR     HERNIA REPAIR       Home Medications Prior to Admission medications   Medication Sig Start Date End Date Taking? Authorizing Provider  acetaminophen (TYLENOL) 500 MG tablet Take 1 tablet (500 mg total) by mouth every 6 (six) hours as needed. 01/05/22   Fayrene Helper, PA-C  benzonatate (TESSALON) 100 MG capsule Take 1 capsule (100 mg total) by mouth every 8 (eight) hours. 01/05/22   Fayrene Helper, PA-C  diclofenac (VOLTAREN) 75 MG EC tablet Take 1 tablet (75 mg total) by mouth 2 (two) times daily. 01/10/20   Elson Areas, PA-C      Allergies    Fish allergy    Review of Systems   Review of Systems  Gastrointestinal:  Positive for vomiting.    Physical Exam Updated Vital Signs BP 134/78 (BP Location: Left Arm)   Pulse 68   Temp 97.7 F (36.5 C)   Resp 20   Ht 5\' 4"  (1.626 m)   Wt 88 kg   LMP 05/30/2022   SpO2 99%   BMI 33.30 kg/m  Physical Exam Vitals and nursing note reviewed.  Constitutional:      Appearance: Normal appearance.  HENT:     Head: Normocephalic and atraumatic.     Mouth/Throat:     Mouth: Mucous membranes are moist.   Eyes:     General: No scleral icterus. Cardiovascular:     Rate and Rhythm: Normal rate and regular rhythm.     Pulses: Normal pulses.     Heart sounds: Normal heart sounds.  Pulmonary:     Effort: Pulmonary effort is normal.     Breath sounds: Normal breath sounds.  Abdominal:     General: Abdomen is flat.     Palpations: Abdomen is soft.     Tenderness: There is no abdominal tenderness.  Musculoskeletal:        General: No deformity.  Skin:    General: Skin is warm.     Findings: No rash.  Neurological:     General: No focal deficit present.     Mental Status: She is alert.  Psychiatric:        Mood and Affect: Mood normal.     ED Results / Procedures / Treatments   Labs (all labs ordered are listed, but only abnormal results are displayed) Labs Reviewed  LIPASE, BLOOD  COMPREHENSIVE METABOLIC PANEL  CBC  URINALYSIS, ROUTINE W REFLEX MICROSCOPIC  POC URINE PREG, ED    EKG None  Radiology No results found.  Procedures Procedures    Medications Ordered in ED Medications  ondansetron (  ZOFRAN-ODT) disintegrating tablet 4 mg (4 mg Oral Given 06/08/22 1123)    ED Course/ Medical Decision Making/ A&P                             Medical Decision Making Amount and/or Complexity of Data Reviewed Labs: ordered.  Risk Prescription drug management.   This patient presents to the ED for nausea, vomiting, diarrhea, this involves an extensive number of treatment options, and is a complaint that carries with a high risk of complications and morbidity.  The differential diagnosis includes small bowel obstruction, acute gastroenteritis, appendicitis, gallbladder/biliary, pancreatitis, peptic ulcer disease, vertigo ACS/MI DKA, EtOH intoxication, cannabinoid hyperemesis.  This is not an exhaustive list.  Lab tests: I ordered and personally interpreted labs.  The pertinent results include: WBC 12.4. Hbg unremarkable. Platelets unremarkable. Electrolytes unremarkable.  BUN, creatinine unremarkable.  Urine pregnancy test negative.  Lipase normal.  UA unremarkable.  Imaging studies:  Problem list/ ED course/ Critical interventions/ Medical management: HPI: See above Vital signs within normal range and stable throughout visit. Laboratory/imaging studies significant for: See above. On physical examination, patient is afebrile and appears in no acute distress.This patient presents with nausea, vomiting & diarrhea. Differential diagnosis includes possible acute gastroenteritis. Abdominal exam without peritoneal signs. Currently euvolemic without evidence of dehydration. Doubt invasive bacteria causing diarrhea such as C diff (no recent antibiotics), shiga toxin (non bloody). No recent travel. Patient is not immunocompromised. Diarrhea is non bloody so less likely inflammatory bowel disease. No evidence of surgical abdomen or other acute medical emergency including bowel obstruction, viscus perforation, vascular catastrophe, atypical appendicitis, acute cholecystitis, UGIB, thyrotoxicosis, or diverticulitis at this time. Presentation not consistent with other acute, emergent causes of vomiting / diarrhea at this time. No indication for abdominal imaging.  Patient tolerates p.o. and is stable for discharge at this point.  Advised patient to take her medication as prescribed, follow-up with PCP for further evaluation and management.  Strict return precaution discussed.  I have reviewed the patient home medicines and have made adjustments as needed.  Cardiac monitoring/EKG: The patient was maintained on a cardiac monitor.  I personally reviewed and interpreted the cardiac monitor which showed an underlying rhythm of: sinus rhythm.  Additional history obtained: External records from outside source obtained and reviewed including: Chart review including previous notes, labs, imaging.  Consultations obtained:  Disposition Continued outpatient therapy. Follow-up with PCP  recommended for reevaluation of symptoms. Treatment plan discussed with patient.  Pt acknowledged understanding was agreeable to the plan. Worrisome signs and symptoms were discussed with patient, and patient acknowledged understanding to return to the ED if they noticed these signs and symptoms. Patient was stable upon discharge.   This chart was dictated using voice recognition software.  Despite best efforts to proofread,  errors can occur which can change the documentation meaning.          Final Clinical Impression(s) / ED Diagnoses Final diagnoses:  Nausea vomiting and diarrhea    Rx / DC Orders ED Discharge Orders          Ordered    ondansetron (ZOFRAN-ODT) 4 MG disintegrating tablet  Every 8 hours PRN        06/08/22 1327    loperamide (IMODIUM) 2 MG capsule  4 times daily PRN        06/08/22 1327    acetaminophen (TYLENOL) 325 MG tablet  Every 6 hours PRN  06/08/22 1327              Jeanelle Malling, PA 06/08/22 1331    Terrilee Files, MD 06/08/22 1726

## 2022-06-08 NOTE — Discharge Instructions (Signed)
Please take your medications as prescribed. Take tylenol/ibuprofen for pain. I recommend close follow-up with PCP for reevaluation.  Please do not hesitate to return to emergency department if worrisome signs symptoms we discussed become apparent.  

## 2022-06-08 NOTE — ED Triage Notes (Signed)
Pt presents with N/V/D that started this AM.
